# Patient Record
Sex: Female | Born: 1955 | Race: White | Hispanic: No | Marital: Married | Smoking: Never smoker
Health system: Southern US, Community
[De-identification: ages and names within clinical notes are randomized; demographics above are authoritative.]

## PROBLEM LIST (undated history)

## (undated) DIAGNOSIS — F32A Depression, unspecified: Secondary | ICD-10-CM

## (undated) DIAGNOSIS — E669 Obesity, unspecified: Secondary | ICD-10-CM

## (undated) DIAGNOSIS — K449 Diaphragmatic hernia without obstruction or gangrene: Secondary | ICD-10-CM

## (undated) DIAGNOSIS — E7800 Pure hypercholesterolemia, unspecified: Secondary | ICD-10-CM

## (undated) DIAGNOSIS — K219 Gastro-esophageal reflux disease without esophagitis: Secondary | ICD-10-CM

## (undated) DIAGNOSIS — I10 Essential (primary) hypertension: Secondary | ICD-10-CM

## (undated) DIAGNOSIS — F419 Anxiety disorder, unspecified: Secondary | ICD-10-CM

## (undated) DIAGNOSIS — F329 Major depressive disorder, single episode, unspecified: Secondary | ICD-10-CM

## (undated) DIAGNOSIS — F341 Dysthymic disorder: Secondary | ICD-10-CM

## (undated) DIAGNOSIS — R7303 Prediabetes: Secondary | ICD-10-CM

## (undated) DIAGNOSIS — E559 Vitamin D deficiency, unspecified: Secondary | ICD-10-CM

## (undated) HISTORY — DX: Diaphragmatic hernia without obstruction or gangrene: K44.9

## (undated) HISTORY — DX: Essential (primary) hypertension: I10

## (undated) HISTORY — DX: Vitamin D deficiency, unspecified: E55.9

## (undated) HISTORY — DX: Prediabetes: R73.03

## (undated) HISTORY — DX: Dysthymic disorder: F34.1

## (undated) HISTORY — DX: Pure hypercholesterolemia, unspecified: E78.00

## (undated) HISTORY — DX: Anxiety disorder, unspecified: F41.9

## (undated) HISTORY — DX: Major depressive disorder, single episode, unspecified: F32.9

## (undated) HISTORY — DX: Gastro-esophageal reflux disease without esophagitis: K21.9

## (undated) HISTORY — DX: Obesity, unspecified: E66.9

## (undated) HISTORY — DX: Depression, unspecified: F32.A

---

## 2001-02-06 ENCOUNTER — Encounter: Payer: Self-pay | Admitting: Family Medicine

## 2001-02-06 ENCOUNTER — Ambulatory Visit (HOSPITAL_COMMUNITY): Admission: RE | Admit: 2001-02-06 | Discharge: 2001-02-06 | Payer: Self-pay | Admitting: Family Medicine

## 2001-06-02 ENCOUNTER — Other Ambulatory Visit: Admission: RE | Admit: 2001-06-02 | Discharge: 2001-06-02 | Payer: Self-pay | Admitting: Obstetrics and Gynecology

## 2002-04-08 ENCOUNTER — Encounter: Payer: Self-pay | Admitting: Family Medicine

## 2002-04-08 ENCOUNTER — Ambulatory Visit (HOSPITAL_COMMUNITY): Admission: RE | Admit: 2002-04-08 | Discharge: 2002-04-08 | Payer: Self-pay | Admitting: Family Medicine

## 2003-04-26 ENCOUNTER — Ambulatory Visit (HOSPITAL_COMMUNITY): Admission: RE | Admit: 2003-04-26 | Discharge: 2003-04-26 | Payer: Self-pay | Admitting: Family Medicine

## 2004-04-27 ENCOUNTER — Ambulatory Visit (HOSPITAL_COMMUNITY): Admission: RE | Admit: 2004-04-27 | Discharge: 2004-04-27 | Payer: Self-pay | Admitting: Family Medicine

## 2005-05-03 ENCOUNTER — Ambulatory Visit (HOSPITAL_COMMUNITY): Admission: RE | Admit: 2005-05-03 | Discharge: 2005-05-03 | Payer: Self-pay | Admitting: Family Medicine

## 2005-12-02 ENCOUNTER — Encounter: Admission: RE | Admit: 2005-12-02 | Discharge: 2006-03-02 | Payer: Self-pay | Admitting: Family Medicine

## 2006-05-06 ENCOUNTER — Ambulatory Visit (HOSPITAL_COMMUNITY): Admission: RE | Admit: 2006-05-06 | Discharge: 2006-05-06 | Payer: Self-pay | Admitting: Family Medicine

## 2007-05-08 ENCOUNTER — Ambulatory Visit (HOSPITAL_COMMUNITY): Admission: RE | Admit: 2007-05-08 | Discharge: 2007-05-08 | Payer: Self-pay | Admitting: Family Medicine

## 2008-05-27 ENCOUNTER — Ambulatory Visit (HOSPITAL_COMMUNITY): Admission: RE | Admit: 2008-05-27 | Discharge: 2008-05-27 | Payer: Self-pay | Admitting: Family Medicine

## 2009-06-02 ENCOUNTER — Ambulatory Visit (HOSPITAL_COMMUNITY): Admission: RE | Admit: 2009-06-02 | Discharge: 2009-06-02 | Payer: Self-pay | Admitting: Family Medicine

## 2010-06-05 ENCOUNTER — Other Ambulatory Visit (HOSPITAL_COMMUNITY): Payer: Self-pay | Admitting: Family Medicine

## 2010-06-05 DIAGNOSIS — Z1231 Encounter for screening mammogram for malignant neoplasm of breast: Secondary | ICD-10-CM

## 2010-06-15 ENCOUNTER — Ambulatory Visit (HOSPITAL_COMMUNITY)
Admission: RE | Admit: 2010-06-15 | Discharge: 2010-06-15 | Disposition: A | Discharge status: Home / Self Care | Payer: Managed Care, Other (non HMO) | Source: Ambulatory Visit | Attending: Family Medicine | Admitting: Family Medicine

## 2010-06-15 DIAGNOSIS — Z1231 Encounter for screening mammogram for malignant neoplasm of breast: Secondary | ICD-10-CM | POA: Insufficient documentation

## 2010-09-26 ENCOUNTER — Other Ambulatory Visit: Payer: Self-pay | Admitting: Family Medicine

## 2010-09-26 ENCOUNTER — Ambulatory Visit
Admission: RE | Admit: 2010-09-26 | Discharge: 2010-09-26 | Disposition: A | Discharge status: Home / Self Care | Payer: Managed Care, Other (non HMO) | Source: Ambulatory Visit | Attending: Family Medicine | Admitting: Family Medicine

## 2010-09-26 DIAGNOSIS — R05 Cough: Secondary | ICD-10-CM

## 2011-08-06 ENCOUNTER — Other Ambulatory Visit (HOSPITAL_COMMUNITY): Payer: Self-pay | Admitting: Family Medicine

## 2011-08-06 DIAGNOSIS — Z1231 Encounter for screening mammogram for malignant neoplasm of breast: Secondary | ICD-10-CM

## 2011-08-23 ENCOUNTER — Ambulatory Visit (HOSPITAL_COMMUNITY)
Admission: RE | Admit: 2011-08-23 | Discharge: 2011-08-23 | Disposition: A | Discharge status: Home / Self Care | Payer: BC Managed Care – PPO | Source: Ambulatory Visit | Attending: Family Medicine | Admitting: Family Medicine

## 2011-08-23 DIAGNOSIS — Z1231 Encounter for screening mammogram for malignant neoplasm of breast: Secondary | ICD-10-CM | POA: Insufficient documentation

## 2012-07-30 ENCOUNTER — Other Ambulatory Visit (HOSPITAL_COMMUNITY): Payer: Self-pay | Admitting: Family Medicine

## 2012-07-30 DIAGNOSIS — Z1231 Encounter for screening mammogram for malignant neoplasm of breast: Secondary | ICD-10-CM

## 2012-08-29 IMAGING — CR DG CHEST 2V
2 series · 2 of 2 positions shown · non-contrast
Comparison: None.

CLINICAL DATA: Cough, shortness of breath

CHEST - 2 VIEW

[view not recorded (1 of 2)]
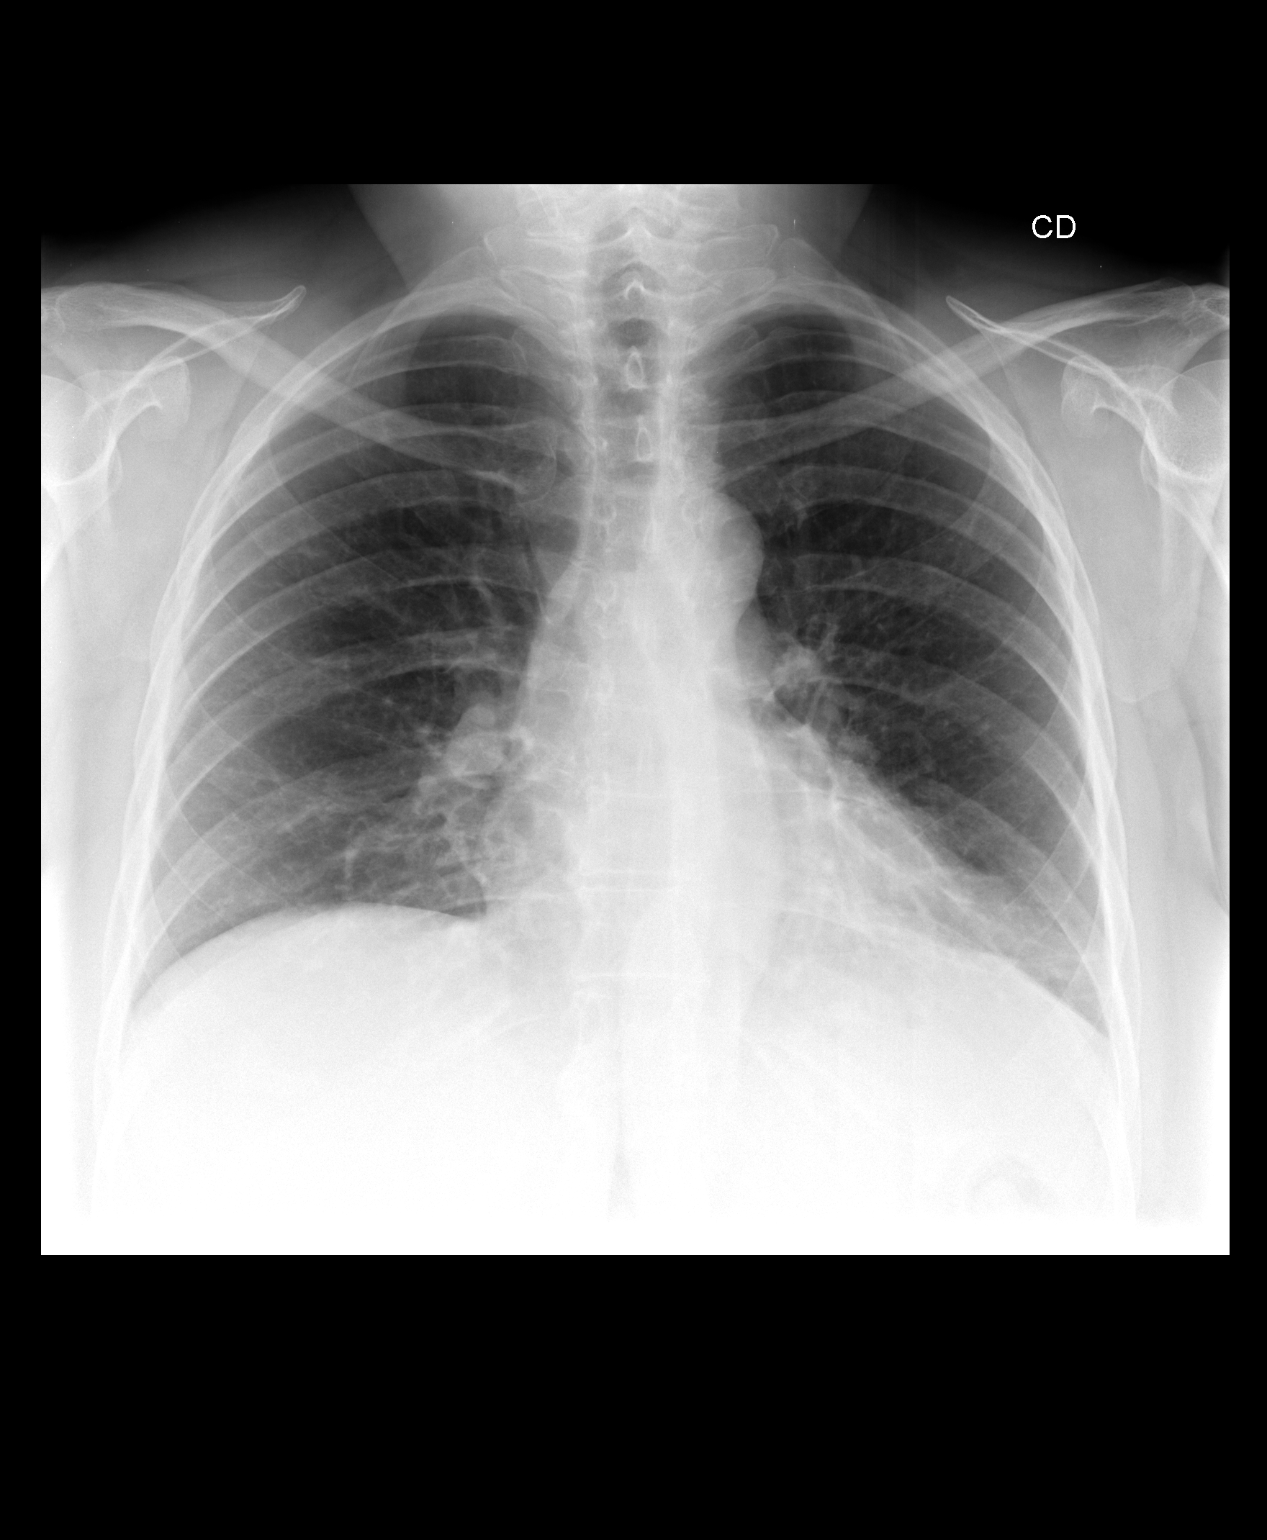

[view not recorded (2 of 2)]
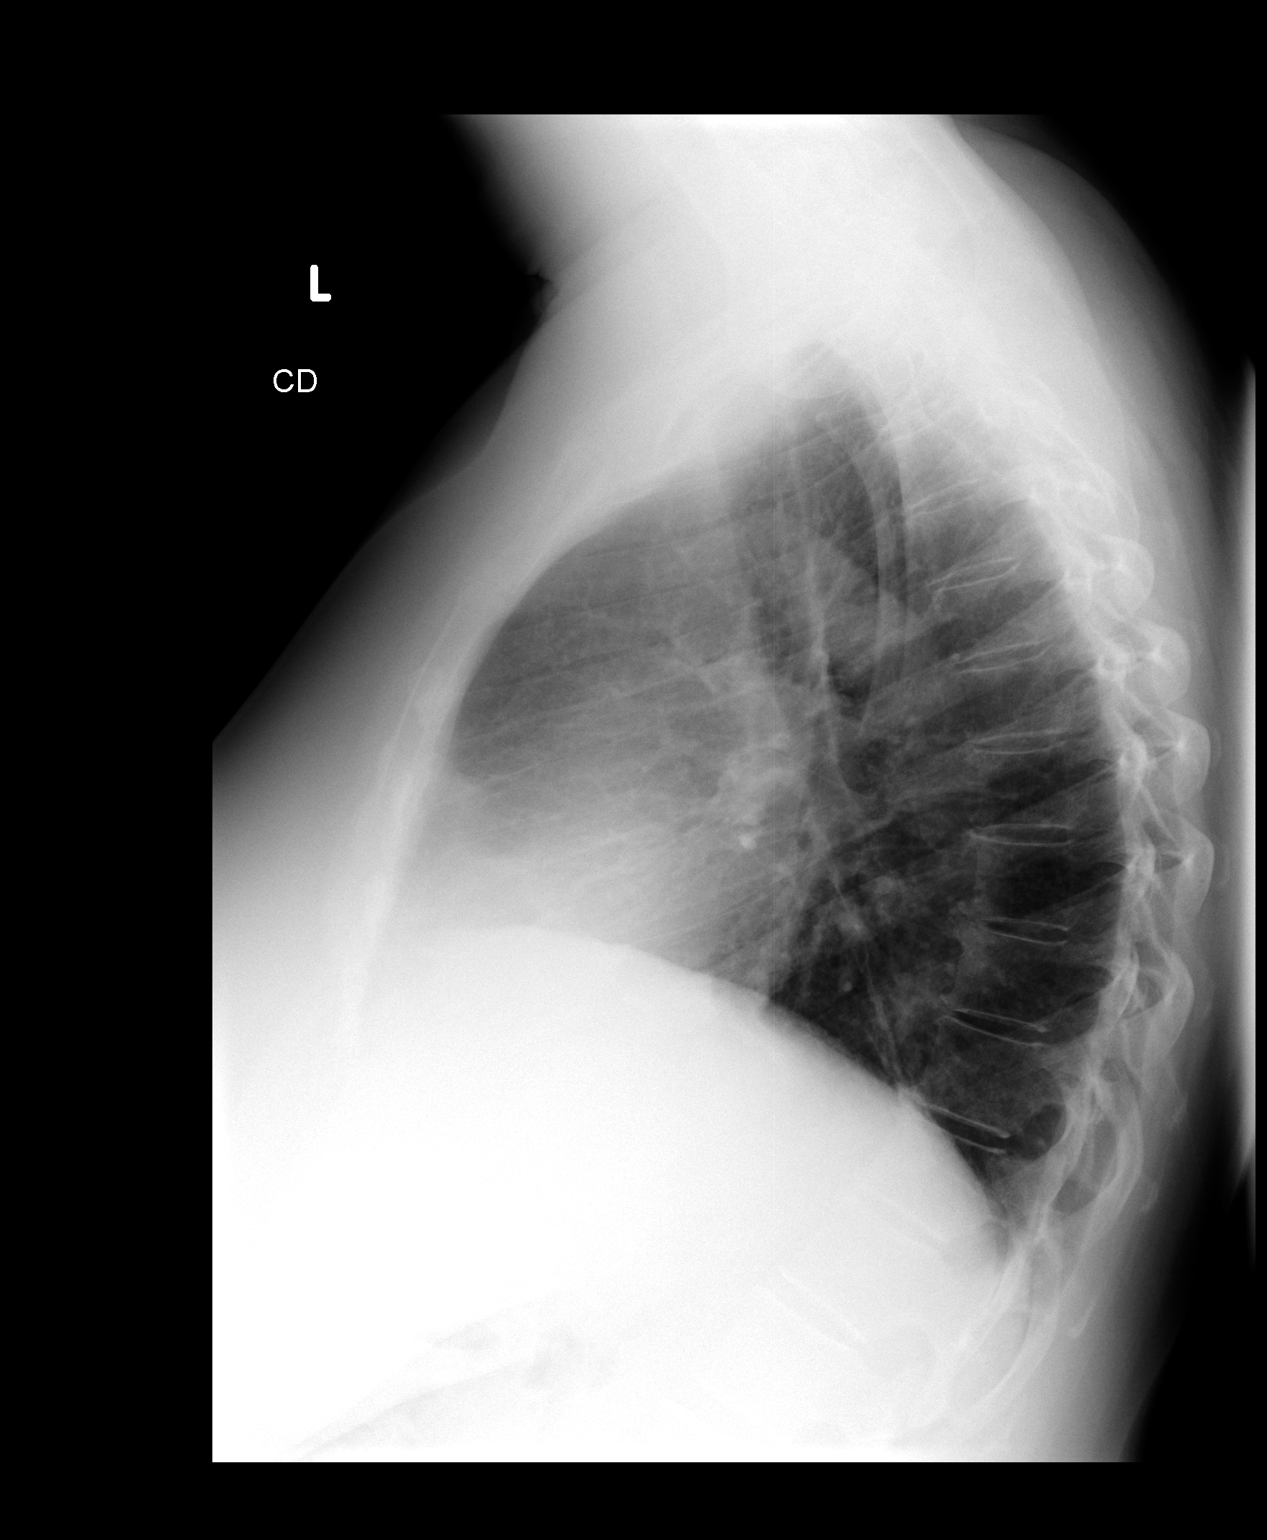

[2 of 2 positions shown; findings below may reference images not displayed]

FINDINGS: Mild left basilar atelectasis.  Lungs otherwise clear.
No pleural effusion or pneumothorax.

Heart is within normal limits.  Nodular opacity overlying the right
hilum, without corresponding abnormality on the lateral view,
likely reflecting confluence of vascular shadows.

Mild degenerative changes of the visualized thoracolumbar spine.
IMPRESSION: Possible mild left basilar atelectasis.  Otherwise, no evidence of
acute cardiopulmonary disease.

## 2012-08-31 ENCOUNTER — Ambulatory Visit (HOSPITAL_COMMUNITY)
Admission: RE | Admit: 2012-08-31 | Discharge: 2012-08-31 | Disposition: A | Discharge status: Home / Self Care | Payer: BC Managed Care – PPO | Source: Ambulatory Visit | Attending: Family Medicine | Admitting: Family Medicine

## 2012-08-31 ENCOUNTER — Other Ambulatory Visit (HOSPITAL_COMMUNITY): Payer: Self-pay | Admitting: Family Medicine

## 2012-08-31 DIAGNOSIS — Z1231 Encounter for screening mammogram for malignant neoplasm of breast: Secondary | ICD-10-CM

## 2012-12-21 ENCOUNTER — Ambulatory Visit: Payer: Self-pay | Admitting: Podiatry

## 2012-12-23 ENCOUNTER — Other Ambulatory Visit (HOSPITAL_COMMUNITY): Payer: Self-pay | Admitting: Obstetrics and Gynecology

## 2012-12-23 DIAGNOSIS — Z780 Asymptomatic menopausal state: Secondary | ICD-10-CM

## 2013-01-04 ENCOUNTER — Ambulatory Visit (HOSPITAL_COMMUNITY)
Admission: RE | Admit: 2013-01-04 | Discharge: 2013-01-04 | Disposition: A | Discharge status: Home / Self Care | Payer: BC Managed Care – PPO | Source: Ambulatory Visit | Attending: Obstetrics and Gynecology | Admitting: Obstetrics and Gynecology

## 2013-01-04 ENCOUNTER — Ambulatory Visit: Payer: BC Managed Care – PPO | Admitting: Podiatry

## 2013-01-04 ENCOUNTER — Encounter: Payer: Self-pay | Admitting: Podiatry

## 2013-01-04 VITALS — BP 119/72 | HR 64 | Resp 12

## 2013-01-04 DIAGNOSIS — B351 Tinea unguium: Secondary | ICD-10-CM

## 2013-01-04 DIAGNOSIS — Z13820 Encounter for screening for osteoporosis: Secondary | ICD-10-CM | POA: Insufficient documentation

## 2013-01-04 DIAGNOSIS — Z780 Asymptomatic menopausal state: Secondary | ICD-10-CM

## 2013-01-04 NOTE — Progress Notes (Signed)
   Subjective:    Patient ID: Makayla Wong, female    DOB: Jul 21, 1955, 57 y.o.   MRN: [id number]  HPI Comments: '' B/L GREAT TOENAIL AND 5TH TOENAIL ARE MUCH BETTER.''     Review of Systems  All other systems reviewed and are negative.       Objective:   Physical Exam        Assessment & Plan:

## 2013-01-04 NOTE — Progress Notes (Signed)
Subjective:     Patient ID: Makayla Wong, female   DOB: 1955/07/18, 57 y.o.   MRN: [id number]  HPI patient states my nails look better   Review of Systems     Objective:   Physical Exam Neurovascular status intact no health history changes noted with nails that are improving as far as yellow numbness and thicknes improving nailbeds with laser    Assessment:     Nailbeds are improving with laser treatment    Plan:     Approximate 1400 pulses for administration to 5 separate nails. Continue formula 3 in recheck

## 2014-09-01 ENCOUNTER — Other Ambulatory Visit: Payer: Self-pay | Admitting: Family Medicine

## 2014-09-01 ENCOUNTER — Ambulatory Visit
Admission: RE | Admit: 2014-09-01 | Discharge: 2014-09-01 | Disposition: A | Discharge status: Home / Self Care | Payer: BC Managed Care – PPO | Source: Ambulatory Visit | Attending: Family Medicine | Admitting: Family Medicine

## 2014-09-01 DIAGNOSIS — T1490XA Injury, unspecified, initial encounter: Secondary | ICD-10-CM

## 2017-06-16 ENCOUNTER — Other Ambulatory Visit: Payer: Self-pay | Admitting: Podiatry

## 2017-06-16 ENCOUNTER — Encounter: Payer: Self-pay | Admitting: Podiatry

## 2017-06-16 ENCOUNTER — Ambulatory Visit (INDEPENDENT_AMBULATORY_CARE_PROVIDER_SITE_OTHER): Payer: BC Managed Care – PPO

## 2017-06-16 ENCOUNTER — Ambulatory Visit: Payer: BC Managed Care – PPO | Admitting: Podiatry

## 2017-06-16 DIAGNOSIS — M79671 Pain in right foot: Secondary | ICD-10-CM | POA: Diagnosis not present

## 2017-06-16 DIAGNOSIS — M79672 Pain in left foot: Principal | ICD-10-CM

## 2017-06-16 DIAGNOSIS — M722 Plantar fascial fibromatosis: Secondary | ICD-10-CM

## 2017-06-16 MED ORDER — TRIAMCINOLONE ACETONIDE 10 MG/ML IJ SUSP
10.0000 mg | Freq: Once | INTRAMUSCULAR | Status: AC
  Administered 2017-06-16: 10 mg

## 2017-06-16 MED ORDER — DICLOFENAC SODIUM 75 MG PO TBEC: 75.0000 mg | DELAYED_RELEASE_TABLET | Freq: Two times a day (BID) | ORAL | 2 refills | Status: DC

## 2017-06-16 NOTE — Patient Instructions (Signed)

## 2017-06-16 NOTE — Progress Notes (Signed)
Subjective:   Patient ID: Makayla Wong, female   DOB: 62 y.o.   MRN: [id number]   HPI Patient presents stating she is having a lot more pain in the left over the right heel and that she is been on her foot more for about the last 8 months.  States is been hurting for around 6 months and patient does not smoke and likes to be active   Review of Systems  All other systems reviewed and are negative.       Objective:  Physical Exam  Constitutional: She appears well-developed and well-nourished.  Cardiovascular: Intact distal pulses.  Pulmonary/Chest: Effort normal.  Musculoskeletal: Normal range of motion.  Neurological: She is alert.  Skin: Skin is warm.  Nursing note and vitals reviewed.   Neurovascular status intact muscle strength is adequate range of motion within normal limits with patient found to have acute inflammation of the plantar aspect left heel at the insertional point of the tendon into the calcaneus with mild discomfort in the right heel.  Patient was noted to have good digital perfusion and is well oriented x3     Assessment:  Inflammatory fasciitis heel left over right with fluid buildup around the medial band     Plan:  H&P condition reviewed and today I injected the plantar fascial left 3 mg Kenalog 5 mg Xylocaine and applied fascial brace to lift up the arch.  Discussed physical therapy shoe gear modifications and placed on diclofenac 75 mg twice daily and reappoint to recheck  X-ray indicates there is spur formation with depression of the arch noted and patient ultimately will need orthotics for this long-term condition

## 2017-06-30 ENCOUNTER — Ambulatory Visit: Payer: BC Managed Care – PPO | Admitting: Podiatry

## 2017-06-30 ENCOUNTER — Encounter: Payer: Self-pay | Admitting: Podiatry

## 2017-06-30 DIAGNOSIS — M722 Plantar fascial fibromatosis: Secondary | ICD-10-CM | POA: Diagnosis not present

## 2017-06-30 MED ORDER — TRIAMCINOLONE ACETONIDE 10 MG/ML IJ SUSP
10.0000 mg | Freq: Once | INTRAMUSCULAR | Status: AC
  Administered 2017-06-30: 10 mg

## 2017-07-02 NOTE — Progress Notes (Signed)
Subjective:   Patient ID: Makayla Wong, female   DOB: 62 y.o.   MRN: [id number]   HPI Patient states that heel is improved but still has an area that is quite painful upon palpation   ROS      Objective:  Physical Exam  Neurovascular status intact with exquisite discomfort plantar aspect left heel at the insertional point of the tendon into the calcaneus with fluid buildup noted around the medial band     Assessment:  Acute plantar fasciitis still present     Plan:  Discussed long-term orthotics which I think will be best for her but at this point I did reinject the plantar fascia 3 mg Kenalog 5 mg Xylocaine explained physical therapy and reappoint to recheck 4 weeks or earlier if needed

## 2017-07-28 ENCOUNTER — Ambulatory Visit: Payer: BC Managed Care – PPO | Admitting: Podiatry

## 2017-07-28 ENCOUNTER — Encounter: Payer: Self-pay | Admitting: Podiatry

## 2017-07-28 DIAGNOSIS — M722 Plantar fascial fibromatosis: Secondary | ICD-10-CM

## 2017-07-30 NOTE — Progress Notes (Signed)
Subjective:   Patient ID: Makayla SparkJody F Pinzon, female   DOB: 62 y.o.   MRN: [id number]   HPI Patient states that heel was feeling quite a bit better with minimal discomfort and able to walk without pain currently.  States that she has modified shoe gear   ROS      Objective:  Physical Exam  Neurovascular status intact with patient's plantar pain left improved with pain still present upon deep palpation quite a bit better     Assessment:  Plantar fasciitis improving at the current time     Plan:  Reviewed physical therapy anti-inflammatories and the usage of supportive shoe gear.  Patient is discharged unless symptoms were to persist or get worse

## 2017-07-31 ENCOUNTER — Telehealth: Payer: Self-pay | Admitting: Podiatry

## 2017-07-31 NOTE — Telephone Encounter (Signed)
I told pt she should wear the plantar fascial brace at all times when weight bearing for at least another 2 weeks, and complete the antiinflammatory, then try short periods of time without the brace, but wear when up for long periods of time like touring or shopping when it was not convenient to rest or reapply the brace and gradually increase the time out of the brace, could refill the antiinflammatory for periods of time when having discomfort, but if continue to have problems make an appt to be reevaluated.

## 2017-07-31 NOTE — Telephone Encounter (Signed)
Pt wanted to know if she needed to wear her brace for plantar fas, all the time. She also wants to know if she needs to continue anti inflammatory meds.

## 2017-09-12 ENCOUNTER — Telehealth: Payer: Self-pay | Admitting: *Deleted

## 2017-09-12 MED ORDER — DICLOFENAC SODIUM 75 MG PO TBEC: 75.0000 mg | DELAYED_RELEASE_TABLET | Freq: Two times a day (BID) | ORAL | 0 refills | Status: DC

## 2017-09-12 NOTE — Telephone Encounter (Signed)
Refill request for diclofenac. Dr. Charlsie Merles states refill once and pt needs an appt prior to future refills.

## 2018-03-06 ENCOUNTER — Other Ambulatory Visit: Payer: Self-pay | Admitting: Family Medicine

## 2018-03-06 ENCOUNTER — Ambulatory Visit
Admission: RE | Admit: 2018-03-06 | Discharge: 2018-03-06 | Disposition: A | Discharge status: Home / Self Care | Payer: BC Managed Care – PPO | Source: Ambulatory Visit | Attending: Family Medicine | Admitting: Family Medicine

## 2018-03-06 DIAGNOSIS — Z01818 Encounter for other preprocedural examination: Secondary | ICD-10-CM

## 2018-03-10 ENCOUNTER — Encounter: Payer: Self-pay | Admitting: Cardiology

## 2018-03-23 ENCOUNTER — Encounter: Payer: Self-pay | Admitting: Cardiology

## 2018-03-24 ENCOUNTER — Telehealth: Payer: Self-pay | Admitting: Cardiology

## 2018-03-24 DIAGNOSIS — R9431 Abnormal electrocardiogram [ECG] [EKG]: Secondary | ICD-10-CM

## 2018-03-24 NOTE — Telephone Encounter (Signed)
Orders placed, message sent to [facility] to schedule.

## 2018-03-24 NOTE — Telephone Encounter (Addendum)
Patient has an appointment scheduled and due to the coron virus outbreak has been called to determine if appointment can be rescheduled.  Patient was referred to cardiology due to abnormal EKG and cardiac clearance for hip surgery.  In review of her EKG it appears essentially normal except for poor R wave progression in the anterior leads.  She has had no active symptoms.  Would recommend 2D echocardiogram in 10 days - her surgery is scheduled for 4/2 - lease set up.

## 2018-03-25 ENCOUNTER — Ambulatory Visit: Payer: BC Managed Care – PPO | Admitting: Cardiology

## 2018-03-31 NOTE — Telephone Encounter (Signed)
Spoke with the patient, he surgery was cancelled and she expressed understanding about delaying her testing till around May. She had no further questions.

## 2018-04-09 ENCOUNTER — Telehealth: Payer: Self-pay | Admitting: Cardiology

## 2018-04-09 NOTE — Telephone Encounter (Signed)
Makayla Wong from [facility] was calling on behalf of the pt. The pt was supposed to have an appt on 03/18 for surgical clearance, but that visit was cancelled.  The pt's surgery is pending cardiac clearance. Is there a way to screen her via virtual visit for clearance?

## 2018-04-10 ENCOUNTER — Telehealth: Payer: Self-pay | Admitting: *Deleted

## 2018-04-10 NOTE — Telephone Encounter (Signed)
TELEPHONE CALL NOTE  This patient has been deemed a candidate for follow-up tele-health visit to limit community exposure during the Covid-19 pandemic. I spoke with the patient via phone to discuss instructions. This has been outlined on the patient's AVS (dotphrase: hcevisitinfo). The patient was advised to review the section on consent for treatment as well. The patient will receive a phone call 2-3 days prior to their E-Visit at which time consent will be verbally confirmed. A Virtual Office Visit appointment type has been scheduled for 8:50 am on 04/10/18 with Dr. Mayford Knife, with "VIDEO" or "TELEPHONE" in the appointment notes - patient prefers  type.  I have either confirmed the patient is active in MyChart or offered to send sign-up link to phone/email via Mychart icon beside patient's photo.  Dustin Flock, RN 04/10/2018 10:52 AM   Makayla Wong has been deemed a candidate for a follow-up tele-health visit to limit community exposure during the Covid-19 pandemic. I spoke with the patient via phone to ensure availability of phone/video source, confirm preferred email & phone number, and discuss instructions and expectations.  I reminded Makayla Wong to be prepared with any vital sign and/or heart rhythm information that could potentially be obtained via home monitoring, at the time of her visit. I reminded Makayla Wong to expect a phone call at the time of her visit if her visit.  Did the patient verbally acknowledge consent to treatment? YES  Dustin Flock, RN 04/10/2018 10:52 AM  CONSENT FOR TELE-HEALTH VISIT - PLEASE REVIEW  I hereby voluntarily request, consent and authorize [facility] and its employed or contracted physicians, physician assistants, nurse practitioners or other licensed health care professionals (the Practitioner), to provide me with telemedicine health care services (the "Services") as deemed necessary by the treating Practitioner. I acknowledge and consent to  receive the Services by the Practitioner via telemedicine. I understand that the telemedicine visit will involve communicating with the Practitioner through live audiovisual communication technology and the disclosure of certain medical information by electronic transmission. I acknowledge that I have been given the opportunity to request an in-person assessment or other available alternative prior to the telemedicine visit and am voluntarily participating in the telemedicine visit.  I understand that I have the right to withhold or withdraw my consent to the use of telemedicine in the course of my care at any time, without affecting my right to future care or treatment, and that the Practitioner or I may terminate the telemedicine visit at any time. I understand that I have the right to inspect all information obtained and/or recorded in the course of the telemedicine visit and may receive copies of available information for a reasonable fee.  I understand that some of the potential risks of receiving the Services via telemedicine include:  Marland Kitchen Delay or interruption in medical evaluation due to technological equipment failure or disruption; . Information transmitted may not be sufficient (e.g. poor resolution of images) to allow for appropriate medical decision making by the Practitioner; and/or  . In rare instances, security protocols could fail, causing a breach of personal health information.  Furthermore, I acknowledge that it is my responsibility to provide information about my medical history, conditions and care that is complete and accurate to the best of my ability. I acknowledge that Practitioner's advice, recommendations, and/or decision may be based on factors not within their control, such as incomplete or inaccurate data provided by me or distortions of diagnostic images or specimens that may  result from electronic transmissions. I understand that the practice of medicine is not an exact science  and that Practitioner makes no warranties or guarantees regarding treatment outcomes. I acknowledge that I will receive a copy of this consent concurrently upon execution via email to the email address I last provided but may also request a printed copy by calling the office of [facility].    I understand that my insurance will be billed for this visit.   I have read or had this consent read to me. . I understand the contents of this consent, which adequately explains the benefits and risks of the Services being provided via telemedicine.  . I have been provided ample opportunity to ask questions regarding this consent and the Services and have had my questions answered to my satisfaction. . I give my informed consent for the services to be provided through the use of telemedicine in my medical care  By participating in this telemedicine visit I agree to the above.

## 2018-04-10 NOTE — Telephone Encounter (Signed)
   [facility] Pre-operative Risk Assessment    Request for surgical clearance:  1. What type of surgery is being performed? LEFT TOTAL HIP ARTHORPLASTY  2. When is this surgery scheduled?  PENDING CLEARANCE   3. What type of clearance is required (medical clearance vs. Pharmacy clearance to hold med vs. Both)?  MEDICAL  4. Are there any medications that need to be held prior to surgery and how long? NONE LISTED   5. Practice name and name of physician performing surgery?  [facility], DR. DALIDORF   6. What is your office phone number [phone]   7.   What is your office fax number [phone]  8.   Anesthesia type (None, local, MAC, general) ? SPINAL   Makayla Wong 04/10/2018, 7:46 AM  _________________________________________________________________   (provider comments below)

## 2018-04-10 NOTE — Telephone Encounter (Signed)
No anticoagulants listed in profile.

## 2018-04-10 NOTE — Telephone Encounter (Signed)
   Primary Cardiologist:Traci Turner, MD  Chart reviewed as part of pre-operative protocol coverage. Patient's original preoperative clearance office visit was canceled due to COVID-19 protocols. I reviewed the chart with Dr. Mayford Knife. Dr. Mayford Knife recommends an echocardiogram prior to clearance. Echocardiogram has bee rescheduled for 05/07/18. She will be scheduled for a telehealth visit following that echocardiogram to complete her cardiac clearance for surgery.   Pre-op covering staff: - Please schedule a telehealth visit appointment with Dr. Mayford Knife, possibly on 05/07/18 following her echocardiogram. - Please call patient to inform them. - Please contact requesting surgeon's office via preferred method (i.e, phone, fax) to inform them of need for appointment prior to surgery.  If applicable, this message will also be routed to pharmacy pool and/or primary cardiologist for input on holding anticoagulant/antiplatelet agent as requested below so that this information is available at time of patient's appointment.   Makayla Rutherford Natoria Archibald, PA  04/10/2018, 8:40 AM

## 2018-04-11 NOTE — Telephone Encounter (Signed)
Ben change her tele visit until after her echo and not this week

## 2018-04-14 NOTE — Telephone Encounter (Signed)
Scheduled appointment on 4/30 at 10:30 with Dr. Mayford Knife.

## 2018-04-17 ENCOUNTER — Telehealth: Payer: BC Managed Care – PPO | Admitting: Cardiology

## 2018-04-30 ENCOUNTER — Telehealth: Payer: Self-pay | Admitting: Cardiovascular Disease

## 2018-04-30 NOTE — Telephone Encounter (Signed)
   Primary Cardiologist:  Armanda Magic, MD   Patient contacted.  History reviewed.    Pt is for pre-op hib replacement Was scheduled for see Turner and have an echo on April 30  Has no cardiac history  Its not clear why she needs cardiac clearance prior to hip surgery  No CP, no dyspnea Is fairly active.  Limited by her hip pain  Works at [employer] center   She is very stable She agrees with delaying the echo since the hip surgery has already been delayed.  I've sent a message to Dr. Mayford Knife to see if she wants to postpone the office visit for 4/30 also   Priority level 3    No symptoms to suggest any unstable cardiac conditions.  Based on discussion, with current pandemic situation, we will be postponing this appointment for Makayla Wong with a plan for f/u in  12  wks or sooner if feasible/necessary.  If symptoms change, she has been instructed to contact our office.   Routing to C19 CANCEL pool for tracking (P CV DIV CV19 CANCEL - reason for visit "other.") and assigning priority ( 3 = >12 wks).   Kristeen Miss, MD  04/30/2018 11:37 AM         .

## 2018-05-04 NOTE — Telephone Encounter (Signed)
OK to keep OV appt as long as it will be video due to preop clearance needed.  SUrgery postponed but can still do visit

## 2018-05-05 NOTE — Telephone Encounter (Signed)
Spoke with the patient, she requested both her virtual visit and echo be rescheduled later once her surgery was scheduled. She will call back to schedule later.

## 2018-05-07 ENCOUNTER — Telehealth: Payer: BC Managed Care – PPO | Admitting: Cardiology

## 2018-05-07 ENCOUNTER — Other Ambulatory Visit (HOSPITAL_COMMUNITY): Payer: BC Managed Care – PPO

## 2018-05-28 ENCOUNTER — Telehealth (HOSPITAL_COMMUNITY): Payer: Self-pay

## 2018-05-28 NOTE — Telephone Encounter (Signed)
LMTCB for schedule echo.

## 2018-06-08 ENCOUNTER — Telehealth (HOSPITAL_COMMUNITY): Payer: Self-pay

## 2018-06-08 NOTE — Telephone Encounter (Signed)

## 2018-06-09 ENCOUNTER — Telehealth (INDEPENDENT_AMBULATORY_CARE_PROVIDER_SITE_OTHER): Payer: BC Managed Care – PPO | Admitting: Cardiology

## 2018-06-09 ENCOUNTER — Ambulatory Visit (HOSPITAL_COMMUNITY): Payer: BC Managed Care – PPO | Attending: Cardiology

## 2018-06-09 ENCOUNTER — Encounter: Payer: Self-pay | Admitting: Cardiology

## 2018-06-09 ENCOUNTER — Other Ambulatory Visit: Payer: Self-pay

## 2018-06-09 DIAGNOSIS — R9431 Abnormal electrocardiogram [ECG] [EKG]: Secondary | ICD-10-CM | POA: Insufficient documentation

## 2018-06-09 DIAGNOSIS — Z01818 Encounter for other preprocedural examination: Secondary | ICD-10-CM

## 2018-06-09 NOTE — Patient Instructions (Signed)
Medication Instructions:  Your physician recommends that you continue on your current medications as directed. Please refer to the Current Medication list given to you today.  If you need a refill on your cardiac medications before your next appointment, please call your pharmacy.   Lab work: None If you have labs (blood work) drawn today and your tests are completely normal, you will receive your results only by: Marland Kitchen MyChart Message (if you have MyChart) OR . A paper copy in the mail If you have any lab test that is abnormal or we need to change your treatment, we will call you to review the results.  Testing/Procedures: None  Follow-Up: As needed, pending results.

## 2018-06-09 NOTE — Progress Notes (Signed)
Virtual Visit via Video Note   This visit type was conducted due to national recommendations for restrictions regarding the COVID-19 Pandemic (e.g. social distancing) in an effort to limit this patient's exposure and mitigate transmission in our community.  Due to her co-morbid illnesses, this patient is at least at moderate risk for complications without adequate follow up.  This format is felt to be most appropriate for this patient at this time.  All issues noted in this document were discussed and addressed.  A limited physical exam was performed with this format.  Please refer to the patient's chart for her consent to telehealth for [facility].   Evaluation Performed:  Cardiology Consult  This visit type was conducted due to national recommendations for restrictions regarding the COVID-19 Pandemic (e.g. social distancing).  This format is felt to be most appropriate for this patient at this time.  All issues noted in this document were discussed and addressed.  No physical exam was performed (except for noted visual exam findings with Video Visits).  Please refer to the patient's chart (MyChart message for video visits and phone note for telephone visits) for the patient's consent to telehealth for [facility].   Date:  06/09/2018   ID:  Makayla Wong, DOB 10-31-1955, MRN [id number]  Patient Location:  Home  Provider location:   [place]  PCP:  Makayla Bussing, MD  Cardiologist:  Makayla Magic, MD Electrophysiologist:  None   Chief Complaint:  Preoperative cardiac clearance.  History of Present Illness:    Makayla Wong is a 63 y.o. female who presents via audio/video conferencing for a telehealth visit today in referral by Makayla Bussing, MD for preoperative cardiac clearance.     This is a 63yo female with a history of depression, GERD with HH, HTN, hyperlipidemia, obesity and preDM.  She is here today for preoperative cardiac clearance for hip replacement surgery.  She was  seen by her PCP for preop clearance and an EKG was done which showed poor R wave progression in the anterior precordial leads.  He felt this was likely due to lead placement in the setting of large breasts but wanted to make sure that there was no cardiac abnormality..  She denies any chest pain or pressure, SOB, DOE, PND, orthopnea, LE edema, dizziness, palpitations or syncope. She is compliant with her meds and is tolerating meds with no SE.    The patient does not have symptoms concerning for COVID-19 infection (fever, chills, cough, or new shortness of breath).    Prior CV studies:   The following studies were reviewed today:  EKG  Past Medical History:  Diagnosis Date   Anxiety    Depression    Dysthymia    GERD (gastroesophageal reflux disease)    Hiatal hernia with GERD    Hypercholesterolemia    Hypertension    Obesity    Pre-diabetes    Vitamin D deficiency    No past surgical history on file.   Current Meds  Medication Sig   ACIPHEX 20 MG tablet Take 20 mg by mouth daily.    ALPRAZolam (XANAX) 0.25 MG tablet Take 0.25 mg by mouth at bedtime as needed for anxiety. Pt takes for flying   atenolol (TENORMIN) 25 MG tablet Take 25 mg by mouth daily.    Multiple Vitamin (MULTIVITAMIN) tablet Take 1 tablet by mouth daily.   sertraline (ZOLOFT) 50 MG tablet Take 50 mg by mouth daily.    triamterene-hydrochlorothiazide (MAXZIDE-25) 37.5-25 MG per  tablet Take 1 tablet by mouth daily.    Vitamin D, Ergocalciferol, (DRISDOL) 50000 UNITS CAPS capsule Take 50,000 Units by mouth every 7 (seven) days.      Allergies:   Hydrocodone; Other; and Percocet [oxycodone-acetaminophen]   Social History   Tobacco Use   Smoking status: Never Smoker   Smokeless tobacco: Never Used  Substance Use Topics   Alcohol use: Yes   Drug use: No     Family Hx: The patient's family history includes Hypertension in her mother; Renal cancer in her father.  ROS:   Please see  the history of present illness.     All other systems reviewed and are negative.   Labs/Other Tests and Data Reviewed:    Recent Labs: No results found for requested labs within last 8760 hours.   Recent Lipid Panel No results found for: CHOL, TRIG, HDL, CHOLHDL, LDLCALC, LDLDIRECT  Wt Readings from Last 3 Encounters:  06/09/18 172 lb (78 kg)     Objective:    Vital Signs:  Ht 5' (1.524 m)    Wt 172 lb (78 kg)    BMI 33.59 kg/m    CONSTITUTIONAL:  Well nourished, well developed female in no acute distress.  EYES: anicteric MOUTH: oral mucosa is pink RESPIRATORY: Normal respiratory effort, symmetric expansion CARDIOVASCULAR: No peripheral edema SKIN: No rash, lesions or ulcers MUSCULOSKELETAL: no digital cyanosis NEURO: Cranial Nerves II-XII grossly intact, moves all extremities PSYCH: Intact judgement and insight.  A&O x 3, Mood/affect appropriate   ASSESSMENT & PLAN:    1.  Preoperative cardiac clearance - she has a hip replacement scheduled.  Her PCP did a preop EKG which showed poor R wave progression was concerned that this represented a prior MI although she has large breasts that he thought it could also be due to that.  She had a 2D echocardiogram done this morning which is pending to make sure that there is no wall motion abnormalities.  She is completely asymptomatic from a cardiac standpoint.  She is to do at least 4 METS of activity.  She works outside in her garden also is able to walk 4 blocks without any chest pain or shortness of breath.  She has never smoked.  She does not have any family history of CAD that she knows of.  Her risk factors include obesity, hypertension and hyperlipidemia.  Her revised cardiac risk index is 0.4% for major cardiac event in the perioperative period.  This is considered low risk and okay to proceed with surgery without any further cardiac work-up.  COVID-19 Education: The signs and symptoms of COVID-19 were discussed with the patient  and how to seek care for testing (follow up with PCP or arrange E-visit).  The importance of social distancing was discussed today.  Patient Risk:   After full review of this patient's clinical status, I feel that they are at least moderate risk at this time.  Time:   Today, I have spent 25 minutes in telehealth discussing medical problems including HTN, lipids and risks for CAD.  We also reviewed the symptoms of COVID 19 and the ways to protect against contracting the virus with telehealth technology.  I spent an additional 5 minutes reviewing patient's chart including EKG and PCP OV notes.  Medication Adjustments/Labs and Tests Ordered: Current medicines are reviewed at length with the patient today.  Concerns regarding medicines are outlined above.  Tests Ordered: No orders of the defined types were placed in this encounter.  Medication  Changes: No orders of the defined types were placed in this encounter.   Disposition:  Follow up prn  Signed, Makayla Magicraci Miquel Stacks, MD  06/09/2018 2:59 PM    [facility]

## 2021-04-25 ENCOUNTER — Other Ambulatory Visit: Payer: Self-pay | Admitting: Obstetrics and Gynecology

## 2021-04-25 DIAGNOSIS — E2839 Other primary ovarian failure: Secondary | ICD-10-CM

## 2021-06-21 ENCOUNTER — Ambulatory Visit
Admission: RE | Admit: 2021-06-21 | Discharge: 2021-06-21 | Disposition: A | Discharge status: Home / Self Care | Payer: BC Managed Care – PPO | Source: Ambulatory Visit | Attending: Obstetrics and Gynecology | Admitting: Obstetrics and Gynecology

## 2021-06-21 DIAGNOSIS — E2839 Other primary ovarian failure: Secondary | ICD-10-CM

## 2023-05-13 ENCOUNTER — Other Ambulatory Visit: Payer: Self-pay | Admitting: Podiatry

## 2023-05-13 ENCOUNTER — Ambulatory Visit: Payer: Self-pay | Admitting: Podiatry

## 2023-05-13 ENCOUNTER — Ambulatory Visit (INDEPENDENT_AMBULATORY_CARE_PROVIDER_SITE_OTHER)

## 2023-05-13 ENCOUNTER — Ambulatory Visit (INDEPENDENT_AMBULATORY_CARE_PROVIDER_SITE_OTHER): Admitting: Podiatry

## 2023-05-13 DIAGNOSIS — M7751 Other enthesopathy of right foot: Secondary | ICD-10-CM

## 2023-05-13 DIAGNOSIS — M8580 Other specified disorders of bone density and structure, unspecified site: Secondary | ICD-10-CM | POA: Insufficient documentation

## 2023-05-13 DIAGNOSIS — L821 Other seborrheic keratosis: Secondary | ICD-10-CM | POA: Insufficient documentation

## 2023-05-13 DIAGNOSIS — I10 Essential (primary) hypertension: Secondary | ICD-10-CM | POA: Insufficient documentation

## 2023-05-13 DIAGNOSIS — K219 Gastro-esophageal reflux disease without esophagitis: Secondary | ICD-10-CM | POA: Insufficient documentation

## 2023-05-13 DIAGNOSIS — N841 Polyp of cervix uteri: Secondary | ICD-10-CM | POA: Insufficient documentation

## 2023-05-13 DIAGNOSIS — E669 Obesity, unspecified: Secondary | ICD-10-CM | POA: Insufficient documentation

## 2023-05-13 DIAGNOSIS — M205X1 Other deformities of toe(s) (acquired), right foot: Secondary | ICD-10-CM | POA: Diagnosis not present

## 2023-05-13 DIAGNOSIS — D259 Leiomyoma of uterus, unspecified: Secondary | ICD-10-CM | POA: Insufficient documentation

## 2023-05-14 NOTE — Progress Notes (Signed)
 Subjective:  Patient ID: Makayla Wong, female    DOB: 1955/08/15,  MRN: [id number] HPI Chief Complaint  Patient presents with   Foot Pain    Pt presents for pain of right foot where she had a bunionectomy, states it hurts when she walk.    68 y.o. female presents with the above complaint.   ROS: Denies fever chills nausea vomit muscle aches pains calf pain back pain chest pain shortness of breath.  Past Medical History:  Diagnosis Date   Anxiety    Depression    Dysthymia    GERD (gastroesophageal reflux disease)    Hiatal hernia with GERD    Hypercholesterolemia    Hypertension    Obesity    Pre-diabetes    Vitamin D deficiency    No past surgical history on file.  Current Outpatient Medications:    atorvastatin (LIPITOR) 20 MG tablet, Take 20 mg by mouth daily., Disp: , Rfl:    ACIPHEX 20 MG tablet, Take 20 mg by mouth daily. , Disp: , Rfl:    ALPRAZolam (XANAX) 0.25 MG tablet, Take 0.25 mg by mouth at bedtime as needed for anxiety. Pt takes for flying, Disp: , Rfl:    atenolol (TENORMIN) 25 MG tablet, Take 25 mg by mouth daily. , Disp: , Rfl:    Multiple Vitamin (MULTIVITAMIN) tablet, Take 1 tablet by mouth daily., Disp: , Rfl:    sertraline (ZOLOFT) 50 MG tablet, Take 50 mg by mouth daily. , Disp: , Rfl:    triamterene-hydrochlorothiazide (MAXZIDE-25) 37.5-25 MG per tablet, Take 1 tablet by mouth daily. , Disp: , Rfl:    Vitamin D, Ergocalciferol, (DRISDOL) 50000 UNITS CAPS capsule, Take 50,000 Units by mouth every 7 (seven) days. , Disp: , Rfl:   Allergies  Allergen Reactions   Aspartame Other (See Comments)    Other Reaction(s): Not available, Unknown   Doxycycline     Other Reaction(s): Unknown   Hydrocodone Nausea And Vomiting   Monosodium Glutamate Other (See Comments)    Other Reaction(s): Not available   Other     Artificial Sweetner - gets N/V; gets a headache   Oxycodone Other (See Comments)   Percocet [Oxycodone-Acetaminophen] Nausea And Vomiting    Review of Systems Objective:  There were no vitals filed for this visit.  General: Well developed, nourished, in no acute distress, alert and oriented x3   Dermatological: Skin is warm, dry and supple bilateral. Nails x 10 are well maintained; remaining integument appears unremarkable at this time. There are no open sores, no preulcerative lesions, no rash or signs of infection present.  Vascular: Dorsalis Pedis artery and Posterior Tibial artery pedal pulses are 2/4 bilateral with immedate capillary fill time. Pedal hair growth present. No varicosities and no lower extremity edema present bilateral.   Neruologic: Grossly intact via light touch bilateral. Vibratory intact via tuning fork bilateral. Protective threshold with Semmes Wienstein monofilament intact to all pedal sites bilateral. Patellar and Achilles deep tendon reflexes 2+ bilateral. No Babinski or clonus noted bilateral.   Musculoskeletal: No gross boney pedal deformities bilateral. No pain, crepitus, or limitation noted with foot and ankle range of motion bilateral. Muscular strength 5/5 in all groups tested bilateral.  She has some tenderness on range of motion of the first metatarsal phalangeal joint of the right foot otherwise no erythema edema cellulitis drainage or odor.  Gait: Unassisted, Nonantalgic.    Radiographs:  Radiographs demonstrate osseously mature individual plantar distally oriented calcaneal heel spur to the rear  foot forefoot demonstrates first metatarsal osteotomy which is gone on to heal uneventfully with 2 screws intact.  The toe is sitting rectus there is joint space narrowing some subchondral eburnation consistent with osteoarthritic change.  Assessment & Plan:   Assessment: Osteoarthritis first metatarsophalangeal joint right.  Plan: Discussed etiology pathology conservative surgical therapies offered injection she declined offered oral medication she declined she and I did discuss stiff soled  shoes she understands and is amendable to it and also is amendable to a topical anti-inflammatory such as Voltaren  gel.     Glendell Schlottman T. [place], [place]

## 2023-06-24 ENCOUNTER — Encounter: Payer: Self-pay | Admitting: Physical Therapy

## 2023-06-24 ENCOUNTER — Other Ambulatory Visit: Payer: Self-pay

## 2023-06-24 ENCOUNTER — Ambulatory Visit: Attending: Family Medicine | Admitting: Physical Therapy

## 2023-06-24 DIAGNOSIS — M6281 Muscle weakness (generalized): Secondary | ICD-10-CM | POA: Insufficient documentation

## 2023-06-24 DIAGNOSIS — R293 Abnormal posture: Secondary | ICD-10-CM | POA: Insufficient documentation

## 2023-06-24 DIAGNOSIS — R279 Unspecified lack of coordination: Secondary | ICD-10-CM | POA: Insufficient documentation

## 2023-06-24 NOTE — Therapy (Signed)
 OUTPATIENT PHYSICAL THERAPY FEMALE PELVIC EVALUATION   Patient Name: Makayla Wong MRN: [id number] DOB:01/27/1955, 68 y.o., female Today's Date: 06/24/2023  END OF SESSION:  PT End of Session - 06/24/23 1702     Visit Number 1    Number of Visits 8    Date for PT Re-Evaluation 07/22/23    Authorization Type Medicare/[employer]    PT Start Time 0415    PT Stop Time 0505    PT Time Calculation (min) 50 min    Activity Tolerance Patient tolerated treatment well    Behavior During Therapy WFL for tasks assessed/performed          Past Medical History:  Diagnosis Date   Anxiety    Depression    Dysthymia    GERD (gastroesophageal reflux disease)    Hiatal hernia with GERD    Hypercholesterolemia    Hypertension    Obesity    Pre-diabetes    Vitamin D deficiency    History reviewed. No pertinent surgical history. Patient Active Problem List   Diagnosis Date Noted   Acid reflux 05/13/2023   Hypertensive disorder 05/13/2023   Obesity 05/13/2023   Osteopenia 05/13/2023   Polyp of cervix 05/13/2023   Seborrheic keratoses 05/13/2023   Uterine leiomyoma 05/13/2023   Preoperative clearance 06/09/2018    PCP: Lanae Pinal, MD   REFERRING PROVIDER: Lanae Pinal, MD   REFERRING DIAG: N39.3 (ICD-10-CM) - Stress incontinence (female) (female)  THERAPY DIAG:  Muscle weakness (generalized)  Unspecified lack of coordination  Abnormal posture  Rationale for Evaluation and Treatment: Rehabilitation  ONSET DATE: March of this year is when it became intense   SUBJECTIVE:                                                                                                                                                                                           SUBJECTIVE STATEMENT: Patient reports to PFPT with stress urinary incontinence. She had a cold in march that exacerbated this. No pelvic pain to report.  Fluid intake: at least 40 oz water daily, fresh OJ in the mornings,  cup of hot tea in the mornings (caffeine)   PAIN:  Are you having pain? No NPRS scale: 0/10  PRECAUTIONS: None  RED FLAGS: None   WEIGHT BEARING RESTRICTIONS: No  FALLS:  Has patient fallen in last 6 months? 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; works in the yard; enjoys walking   PLOF: Independent with basic ADLs  PATIENT  GOALS: to control the leakage from happening   PERTINENT HISTORY:  C section 1991, 2 total hip replacements (2020, 2022)  Hiatal hernia   BOWEL MOVEMENT: Pain with bowel movement: No Type of bowel movement:Type (Bristol Stool Scale) 3-4, Frequency 1x/day, Strain no, and Splinting no Fully empty rectum: Yes:   Leakage: No Pads: No Fiber supplement/laxative Yes - trying to increase her fiber and protein intake naturally with foods   URINATION: Pain with urination: No Fully empty bladder: Yes:   Stream: Strong Urgency: Yes  Frequency: within normal limits - gets up rarely at night to void  Leakage: Urge to void, Coughing, Sneezing, and Laughing Pads: Yes: wears pads if she feels like she needs it   INTERCOURSE: is currently trying to become more sexually active   Ability to have vaginal penetration No  Pain with intercourse: Initial Penetration DrynessYes  Climax: yes Marinoff Scale: 1/3  PREGNANCY: Vaginal deliveries 0 C-section deliveries 1 Currently pregnant No  PROLAPSE: None  OBJECTIVE:  Note: Objective measures were completed at Evaluation unless otherwise noted.  PATIENT SURVEYS:   PFIQ-7: 16  COGNITION: Overall cognitive status: Within functional limits for tasks assessed     SENSATION: Light touch: Appears intact  LUMBAR SPECIAL TESTS:  Single leg stance test: Positive  FUNCTIONAL TESTS:  Squat: bilateral dynamic knee valgus with loading, general lumbopelvic stiffness  present  GAIT: Assistive device utilized: None Comments: moderate trendelenburg gait pattern with ambulation  POSTURE: rounded shoulders, forward head, and increased thoracic kyphosis  LUMBARAROM/PROM:  A/PROM A/PROM  eval  Flexion 25% limited  Extension 25% limited  Right lateral flexion 25% limited  Left lateral flexion 25% limited  Right rotation 25% limited  Left rotation 25% limited   (Blank rows = not tested)  LOWER EXTREMITY ROM: within functional limits   Active ROM Right eval Left eval  Hip flexion    Hip extension    Hip abduction    Hip adduction    Hip internal rotation    Hip external rotation    Knee flexion    Knee extension    Ankle dorsiflexion    Ankle plantarflexion    Ankle inversion    Ankle eversion     (Blank rows = not tested)  LOWER EXTREMITY MMT: 4/5 bilateral knees and hips grossly  PALPATION:   General: no significant tenderness to palpation of bilateral glutes or hip flexors   Pelvic Alignment: within functional limits   Abdominal: abdominal breathing present at rest with sufficient lower rib excursion                External Perineal Exam: dryness present with sufficient clitoral hood mobility                              Internal Pelvic Floor: Patient fully consents to today's internal examination vaginally. She had no pain with palpation of superficial or deep pelvic floor musculature. With inhalation, patient's pelvic floor lengthens naturally, however she is unable to contract her pelvic floor beyond a small flicker of a contraction. With exhalation, this contraction strength increased to 3/5. She demonstrates a strong lack of coordination in the pelvic floor, but with tactile/verbal cueing form PT, she was able to achieve more active range of motion of the musculature.   Patient confirms identification and approves PT to assess internal pelvic floor and treatment Yes No emotional/communication barriers or cognitive limitation.  Patient is motivated to learn. Patient  understands and agrees with treatment goals and plan. PT explains patient will be examined in standing, sitting, and lying down to see how their muscles and joints work. When they are ready, they will be asked to remove their underwear so PT can examine their perineum. The patient is also given the option of providing their own chaperone as one is not provided in our facility. The patient also has the right and is explained the right to defer or refuse any part of the evaluation or treatment including the internal exam. With the patient's consent, PT will use one gloved finger to gently assess the muscles of the pelvic floor, seeing how well it contracts and relaxes and if there is muscle symmetry. After, the patient will get dressed and PT and patient will discuss exam findings and plan of care. PT and patient discuss plan of care, schedule, attendance policy and HEP activities.  PELVIC MMT:   MMT eval  Vaginal 3/5, 0 quick flicks, 0 second hold  Internal Anal Sphincter   External Anal Sphincter   Puborectalis   Diastasis Recti   (Blank rows = not tested)       TONE: Decreased throughout superficial and deep pelvic floor musculature   PROLAPSE: N/A  TODAY'S TREATMENT:                                                                                                                              DATE:   EVAL 06/24/23: Examination completed, findings reviewed, pt educated on POC, HEP, and self care. Pt motivated to participate in PT and agreeable to attempt recommendations.   Neuro re-ed: Hooklying diaphragmatic breathing + pelvic floor lengthening with inhalation and shortening with exhalation 3x10  Hooklying pelvic floor quick flicks + diaphragmatic breathing 2x10  Self care: Blow as you go technique, relative anatomy and the connection between the diaphragm and pelvic floor musculature, pelvic floor active range of motion and how to achieve that    PATIENT EDUCATION:  Education details: Blow as you go technique, relative anatomy and the connection between the diaphragm and pelvic floor musculature, pelvic floor active range of motion and how to achieve that  Person educated: Patient Education method: Explanation, Demonstration, Tactile cues, Verbal cues, and Handouts Education comprehension: verbalized understanding, returned demonstration, verbal cues required, tactile cues required, and needs further education  HOME EXERCISE PROGRAM: Access Code: [email or web address] URL: [email or web address] Date: 06/24/2023 Prepared by: Robbin Chill  Exercises - Supine Pelvic Floor Contraction  - 1 x daily - 7 x weekly - 3 sets - 10 reps - Quick Flick Pelvic Floor Contractions in Hooklying  - 1 x daily - 7 x weekly - 3 sets - 10 reps  ASSESSMENT:  CLINICAL IMPRESSION: Patient is a 68 y.o. female  who was seen today for physical therapy evaluation and treatment for stress urinary incontinence. This began to increase in intensity in March when she had a cold and was coughing frequently. This leakage is  small in amount, not too large, and is exacerbated with urgency/coughing/sneezing/laughing. Patient fully consents to today's internal examination vaginally. She had no pain with palpation of superficial or deep pelvic floor musculature. With inhalation, patient's pelvic floor lengthens naturally, however she is unable to contract her pelvic floor beyond a small flicker of a contraction. With exhalation, this contraction strength increased to 3/5. She demonstrates a strong lack of coordination in the pelvic floor, but with tactile/verbal cueing form PT, she was able to achieve more active range of motion of the musculature. Introduction to pelvic floor AROM went very well and patient plans to practice this more independently. Overall, patient tolerated session well and Pt would benefit from additional PT to further address deficits.    OBJECTIVE  IMPAIRMENTS: decreased coordination, decreased endurance, decreased mobility, decreased ROM, and decreased strength.   ACTIVITY LIMITATIONS: continence  PARTICIPATION LIMITATIONS: N/A  PERSONAL FACTORS: Age, Past/current experiences, and Time since onset of injury/illness/exacerbation are also affecting patient's functional outcome.   REHAB POTENTIAL: Good  CLINICAL DECISION MAKING: Stable/uncomplicated  EVALUATION COMPLEXITY: Low   GOALS: Goals reviewed with patient? Yes  SHORT TERM GOALS: Target date: 07/22/2023  Pt will be independent with HEP.  Baseline: Goal status: INITIAL  2.  Pt will be independent with diaphragmatic breathing and down training activities in order to improve pelvic floor relaxation. Baseline:  Goal status: INITIAL  3.  Pt will be independent with the knack, urge suppression technique, and double voiding in order to improve bladder habits and decrease urinary incontinence.   Baseline:  Goal status: INITIAL  4.  Pt will be able to correctly perform diaphragmatic breathing and appropriate pressure management in order to prevent worsening vaginal wall laxity and improve pelvic floor A/ROM.  Baseline:  Goal status: INITIAL  LONG TERM GOALS: Target date: 12/24/2023  Pt will be independent with advanced HEP.  Baseline:  Goal status: INITIAL  2.  Pt to demonstrate improved coordination of pelvic floor and breathing mechanics with 10# squat with appropriate synergistic patterns to decrease pain and leakage at least 75% of the time for improved ability to complete a 30 minute workout with strain at pelvic floor and symptoms.   Baseline:  Goal status: INITIAL  3.  Pt will have 75% reduced leakage during a typical day to suggest improved overall pelvic floor control and urinary management techniques. Baseline:  Goal status: INITIAL  4.  Pt will demonstrate 3+/5 pelvic floor muscle strength with appropriate coordination in order to decrease urinary  incontinence, urgency and frequency.   Baseline:  Goal status: INITIAL  PLAN:  PT FREQUENCY: 1-2x/week  PT DURATION: 12 weeks  PLANNED INTERVENTIONS: 97110-Therapeutic exercises, 97530- Therapeutic activity, 97112- Neuromuscular re-education, 97535- Self Care, 16109- Manual therapy, Patient/Family education, Balance training, Taping, Joint mobilization, Spinal mobilization, Scar mobilization, Cryotherapy, and Moist heat  PLAN FOR NEXT SESSION: continued pelvic floor AROM in seated, introduce hip and core strengthening, introduce knack technique and urge drill  Marni Sins, PT 06/24/2023, 5:16 PM

## 2023-08-01 ENCOUNTER — Ambulatory Visit: Admitting: Physical Therapy

## 2023-08-05 ENCOUNTER — Ambulatory Visit: Admitting: Physical Therapy

## 2023-08-12 ENCOUNTER — Ambulatory Visit: Attending: Family Medicine | Admitting: Physical Therapy

## 2023-08-12 DIAGNOSIS — M6281 Muscle weakness (generalized): Secondary | ICD-10-CM | POA: Diagnosis present

## 2023-08-12 DIAGNOSIS — R293 Abnormal posture: Secondary | ICD-10-CM | POA: Insufficient documentation

## 2023-08-12 DIAGNOSIS — R279 Unspecified lack of coordination: Secondary | ICD-10-CM | POA: Diagnosis present

## 2023-08-12 NOTE — Therapy (Signed)
 OUTPATIENT PHYSICAL THERAPY FEMALE PELVIC TREATMENT   Patient Name: Makayla Wong MRN: [id number] DOB:06/13/1955, 68 y.o., female Today's Date: 08/12/2023  END OF SESSION:  PT End of Session - 08/12/23 1051     Visit Number 2    Number of Visits 8    Date for PT Re-Evaluation 07/22/23    Authorization Type Medicare/[employer]    PT Start Time 1015    PT Stop Time 1055    PT Time Calculation (min) 40 min    Activity Tolerance Patient tolerated treatment well    Behavior During Therapy WFL for tasks assessed/performed           Past Medical History:  Diagnosis Date   Anxiety    Depression    Dysthymia    GERD (gastroesophageal reflux disease)    Hiatal hernia with GERD    Hypercholesterolemia    Hypertension    Obesity    Pre-diabetes    Vitamin D deficiency    No past surgical history on file. Patient Active Problem List   Diagnosis Date Noted   Acid reflux 05/13/2023   Hypertensive disorder 05/13/2023   Obesity 05/13/2023   Osteopenia 05/13/2023   Polyp of cervix 05/13/2023   Seborrheic keratoses 05/13/2023   Uterine leiomyoma 05/13/2023   Preoperative clearance 06/09/2018    PCP: Regino Slater, MD   REFERRING PROVIDER: Regino Slater, MD   REFERRING DIAG: N39.3 (ICD-10-CM) - Stress incontinence (female) (female)  THERAPY DIAG:  Muscle weakness (generalized)  Unspecified lack of coordination  Abnormal posture  Rationale for Evaluation and Treatment: Rehabilitation  ONSET DATE: March of this year is when it became intense   SUBJECTIVE:                                                                                                                                                                                           SUBJECTIVE STATEMENT: No questions after last visit. She has been trying to more mindful of picking up the blueberry. 0/10 pain. No leakage this morning, but this past week she has had some leakage with coughing. Single coughs don't cause  any leakage, but coughing fits sometimes do.  No bowel related concerns to report.   From eval: Patient reports to PFPT with stress urinary incontinence. She had a cold in march that exacerbated this. No pelvic pain to report.  Fluid intake: at least 40 oz water daily, fresh OJ in the mornings, cup of hot tea in the mornings (caffeine)   PAIN:  Are you having pain? No NPRS scale: 0/10  PRECAUTIONS: None  RED FLAGS: None   WEIGHT BEARING RESTRICTIONS: No  FALLS:  Has patient fallen in last 6 months? 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; works in the yard; enjoys walking   PLOF: Independent with basic ADLs  PATIENT GOALS: to control the leakage from happening   PERTINENT HISTORY:  C section 1991, 2 total hip replacements (2020, 2022)  Hiatal hernia   BOWEL MOVEMENT: Pain with bowel movement: No Type of bowel movement:Type (Bristol Stool Scale) 3-4, Frequency 1x/day, Strain no, and Splinting no Fully empty rectum: Yes:   Leakage: No Pads: No Fiber supplement/laxative Yes - trying to increase her fiber and protein intake naturally with foods   URINATION: Pain with urination: No Fully empty bladder: Yes:   Stream: Strong Urgency: Yes  Frequency: within normal limits - gets up rarely at night to void  Leakage: Urge to void, Coughing, Sneezing, and Laughing Pads: Yes: wears pads if she feels like she needs it   INTERCOURSE: is currently trying to become more sexually active   Ability to have vaginal penetration No  Pain with intercourse: Initial Penetration DrynessYes  Climax: yes Marinoff Scale: 1/3  PREGNANCY: Vaginal deliveries 0 C-section deliveries 1 Currently pregnant No  PROLAPSE: None  OBJECTIVE:  Note: Objective measures were completed at Evaluation unless otherwise noted.  PATIENT SURVEYS:   PFIQ-7:  16  COGNITION: Overall cognitive status: Within functional limits for tasks assessed     SENSATION: Light touch: Appears intact  LUMBAR SPECIAL TESTS:  Single leg stance test: Positive  FUNCTIONAL TESTS:  Squat: bilateral dynamic knee valgus with loading, general lumbopelvic stiffness present  GAIT: Assistive device utilized: None Comments: moderate trendelenburg gait pattern with ambulation  POSTURE: rounded shoulders, forward head, and increased thoracic kyphosis  LUMBARAROM/PROM:  A/PROM A/PROM  eval  Flexion 25% limited  Extension 25% limited  Right lateral flexion 25% limited  Left lateral flexion 25% limited  Right rotation 25% limited  Left rotation 25% limited   (Blank rows = not tested)  LOWER EXTREMITY ROM: within functional limits   Active ROM Right eval Left eval  Hip flexion    Hip extension    Hip abduction    Hip adduction    Hip internal rotation    Hip external rotation    Knee flexion    Knee extension    Ankle dorsiflexion    Ankle plantarflexion    Ankle inversion    Ankle eversion     (Blank rows = not tested)  LOWER EXTREMITY MMT: 4/5 bilateral knees and hips grossly  PALPATION:   General: no significant tenderness to palpation of bilateral glutes or hip flexors   Pelvic Alignment: within functional limits   Abdominal: abdominal breathing present at rest with sufficient lower rib excursion                External Perineal Exam: dryness present with sufficient clitoral hood mobility                              Internal Pelvic Floor: Patient fully consents to today's internal examination vaginally. She had no pain with palpation of superficial or deep pelvic floor musculature. With inhalation, patient's pelvic floor lengthens naturally, however she is unable to contract her pelvic floor beyond a small flicker of a contraction. With exhalation, this contraction strength increased to 3/5. She demonstrates a  strong lack of coordination  in the pelvic floor, but with tactile/verbal cueing form PT, she was able to achieve more active range of motion of the musculature.   Patient confirms identification and approves PT to assess internal pelvic floor and treatment Yes No emotional/communication barriers or cognitive limitation. Patient is motivated to learn. Patient understands and agrees with treatment goals and plan. PT explains patient will be examined in standing, sitting, and lying down to see how their muscles and joints work. When they are ready, they will be asked to remove their underwear so PT can examine their perineum. The patient is also given the option of providing their own chaperone as one is not provided in our facility. The patient also has the right and is explained the right to defer or refuse any part of the evaluation or treatment including the internal exam. With the patient's consent, PT will use one gloved finger to gently assess the muscles of the pelvic floor, seeing how well it contracts and relaxes and if there is muscle symmetry. After, the patient will get dressed and PT and patient will discuss exam findings and plan of care. PT and patient discuss plan of care, schedule, attendance policy and HEP activities.  PELVIC MMT:   MMT eval  Vaginal 3/5, 0 quick flicks, 0 second hold  Internal Anal Sphincter   External Anal Sphincter   Puborectalis   Diastasis Recti   (Blank rows = not tested)       TONE: Decreased throughout superficial and deep pelvic floor musculature   PROLAPSE: N/A  TODAY'S TREATMENT:                                                                                                                              DATE:   EVAL 06/24/23: Examination completed, findings reviewed, pt educated on POC, HEP, and self care. Pt motivated to participate in PT and agreeable to attempt recommendations.   Neuro re-ed: Hooklying diaphragmatic breathing + pelvic floor lengthening with inhalation and  shortening with exhalation 3x10  Hooklying pelvic floor quick flicks + diaphragmatic breathing 2x10  Self care: Blow as you go technique, relative anatomy and the connection between the diaphragm and pelvic floor musculature, pelvic floor active range of motion and how to achieve that   08/12/23: Neuro re-ed: seated diaphragmatic breathing + pelvic floor lengthening with inhalation and shortening with exhalation 3x10  seated pelvic floor quick flicks + diaphragmatic breathing 2x10  Therapeutic exercise: Sit to stand + adductor ball squeeze + diaphragmatic breathing 2x8  Bridge + adductor ball squeeze + diaphragmatic breathing 2x8  Sidelying clamshell + reverse clamshell + diaphragmatic breathing 2x10  Lower trunk rotations + diaphragmatic breathing 2x10  Pretzel piriformis stretch + diaphragmatic breathing 2x31min  Self care: Blow as you go technique, relative anatomy and the connection between the diaphragm and pelvic floor musculature, pelvic floor active range of motion and how to achieve that  Knack technique for stress  urinary incontinence   PATIENT EDUCATION:  Education details: Blow as you go technique, relative anatomy and the connection between the diaphragm and pelvic floor musculature, pelvic floor active range of motion and how to achieve that  Person educated: Patient Education method: Explanation, Demonstration, Tactile cues, Verbal cues, and Handouts Education comprehension: verbalized understanding, returned demonstration, verbal cues required, tactile cues required, and needs further education  HOME EXERCISE PROGRAM: Access Code: [email or web address] URL: [email or web address] Date: 08/12/2023 Prepared by: Celena Domino  Exercises - Seated Pelvic Floor Contraction  - 1 x daily - 7 x weekly - 2 sets - 10 reps - Seated Quick Flick Pelvic Floor Contractions  - 1 x daily - 7 x weekly - 2 sets - 10 reps - Sit to Stand with Mercer Between Knees  - 1 x daily - 7 x weekly - 2  sets - 8-10 reps - Supine Bridge with Mini Swiss Ball Between Knees  - 1 x daily - 7 x weekly - 2 sets - 10 reps - Clamshell  - 1 x daily - 7 x weekly - 2 sets - 10 reps - Sidelying Reverse Clamshell  - 1 x daily - 7 x weekly - 2 sets - 10 reps - Supine Figure 4 Piriformis Stretch  - 1 x daily - 7 x weekly - 2 sets - hold - Supine Lower Trunk Rotation  - 1 x daily - 7 x weekly - 2 sets - 10 reps  ASSESSMENT:  CLINICAL IMPRESSION: Patient is a 68 y.o. female  who was seen today for physical therapy treatment for stress urinary incontinence.Leakage has been better since first session, still leaking with coughing. Knack technique introduced for stress urinary incontinence management and patient introduced to hip and core strengthening. She tolerated this very well with minimal verbal cueing from PT. Overall, patient tolerated session well and Pt would benefit from additional PT to further address deficits.    OBJECTIVE IMPAIRMENTS: decreased coordination, decreased endurance, decreased mobility, decreased ROM, and decreased strength.   ACTIVITY LIMITATIONS: continence  PARTICIPATION LIMITATIONS: N/A  PERSONAL FACTORS: Age, Past/current experiences, and Time since onset of injury/illness/exacerbation are also affecting patient's functional outcome.   REHAB POTENTIAL: Good  CLINICAL DECISION MAKING: Stable/uncomplicated  EVALUATION COMPLEXITY: Low   GOALS: Goals reviewed with patient? Yes  SHORT TERM GOALS: Target date: 07/22/2023  Pt will be independent with HEP.  Baseline: Goal status: INITIAL  2.  Pt will be independent with diaphragmatic breathing and down training activities in order to improve pelvic floor relaxation. Baseline:  Goal status: INITIAL  3.  Pt will be independent with the knack, urge suppression technique, and double voiding in order to improve bladder habits and decrease urinary incontinence.   Baseline:  Goal status: INITIAL  4.  Pt will be able to  correctly perform diaphragmatic breathing and appropriate pressure management in order to prevent worsening vaginal wall laxity and improve pelvic floor A/ROM.  Baseline:  Goal status: INITIAL  LONG TERM GOALS: Target date: 12/24/2023  Pt will be independent with advanced HEP.  Baseline:  Goal status: INITIAL  2.  Pt to demonstrate improved coordination of pelvic floor and breathing mechanics with 10# squat with appropriate synergistic patterns to decrease pain and leakage at least 75% of the time for improved ability to complete a 30 minute workout with strain at pelvic floor and symptoms.   Baseline:  Goal status: INITIAL  3.  Pt will have 75% reduced leakage during a typical day to suggest improved overall  pelvic floor control and urinary management techniques. Baseline:  Goal status: INITIAL  4.  Pt will demonstrate 3+/5 pelvic floor muscle strength with appropriate coordination in order to decrease urinary incontinence, urgency and frequency.   Baseline:  Goal status: INITIAL  PLAN:  PT FREQUENCY: 1-2x/week  PT DURATION: 12 weeks  PLANNED INTERVENTIONS: 97110-Therapeutic exercises, 97530- Therapeutic activity, 97112- Neuromuscular re-education, 97535- Self Care, [id number]- Manual therapy, Patient/Family education, Balance training, Taping, Joint mobilization, Spinal mobilization, Scar mobilization, Cryotherapy, and Moist heat  PLAN FOR NEXT SESSION: continued pelvic floor AROM in seated, introduce hip and core strengthening, introduce knack technique and urge drill  Celena JAYSON Domino, PT 08/12/2023, 10:56 AM

## 2023-08-19 ENCOUNTER — Ambulatory Visit: Admitting: Physical Therapy

## 2023-08-19 DIAGNOSIS — R279 Unspecified lack of coordination: Secondary | ICD-10-CM

## 2023-08-19 DIAGNOSIS — M6281 Muscle weakness (generalized): Secondary | ICD-10-CM | POA: Diagnosis not present

## 2023-08-19 NOTE — Therapy (Signed)
 OUTPATIENT PHYSICAL THERAPY FEMALE PELVIC TREATMENT   Patient Name: Makayla Wong MRN: [id number] DOB:28-Jun-1955, 68 y.o., female Today's Date: 08/19/2023  END OF SESSION:  PT End of Session - 08/19/23 1522     Visit Number 3    Number of Visits 8    Date for PT Re-Evaluation 07/22/23    Authorization Type Medicare/[employer]    PT Start Time 0245    PT Stop Time 0325    PT Time Calculation (min) 40 min    Activity Tolerance Patient tolerated treatment well    Behavior During Therapy WFL for tasks assessed/performed            Past Medical History:  Diagnosis Date   Anxiety    Depression    Dysthymia    GERD (gastroesophageal reflux disease)    Hiatal hernia with GERD    Hypercholesterolemia    Hypertension    Obesity    Pre-diabetes    Vitamin D deficiency    No past surgical history on file. Patient Active Problem List   Diagnosis Date Noted   Acid reflux 05/13/2023   Hypertensive disorder 05/13/2023   Obesity 05/13/2023   Osteopenia 05/13/2023   Polyp of cervix 05/13/2023   Seborrheic keratoses 05/13/2023   Uterine leiomyoma 05/13/2023   Preoperative clearance 06/09/2018    PCP: Regino Slater, MD   REFERRING PROVIDER: Regino Slater, MD   REFERRING DIAG: N39.3 (ICD-10-CM) - Stress incontinence (female) (female)  THERAPY DIAG:  Muscle weakness (generalized)  Unspecified lack of coordination  Rationale for Evaluation and Treatment: Rehabilitation  ONSET DATE: March of this year is when it became intense   SUBJECTIVE:                                                                                                                                                                                           SUBJECTIVE STATEMENT: No questions after last visit. 0/10 pain today. Urinary leakage has been doing much better - she has been trying the knack technique for stress urinary incontinence and it is helping. No major urge incontinence. No bowel related issues  whatsoever.   From eval: Patient reports to PFPT with stress urinary incontinence. She had a cold in march that exacerbated this. No pelvic pain to report.  Fluid intake: at least 40 oz water daily, fresh OJ in the mornings, cup of hot tea in the mornings (caffeine)   PAIN:  Are you having pain? No NPRS scale: 0/10  PRECAUTIONS: None  RED FLAGS: None   WEIGHT BEARING RESTRICTIONS: No  FALLS:  Has patient fallen in last 6 months? 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; works in the yard; enjoys walking   PLOF: Independent with basic ADLs  PATIENT GOALS: to control the leakage from happening   PERTINENT HISTORY:  C section 1991, 2 total hip replacements (2020, 2022)  Hiatal hernia   BOWEL MOVEMENT: Pain with bowel movement: No Type of bowel movement:Type (Bristol Stool Scale) 3-4, Frequency 1x/day, Strain no, and Splinting no Fully empty rectum: Yes:   Leakage: No Pads: No Fiber supplement/laxative Yes - trying to increase her fiber and protein intake naturally with foods   URINATION: Pain with urination: No Fully empty bladder: Yes:   Stream: Strong Urgency: Yes  Frequency: within normal limits - gets up rarely at night to void  Leakage: Urge to void, Coughing, Sneezing, and Laughing Pads: Yes: wears pads if she feels like she needs it   INTERCOURSE: is currently trying to become more sexually active   Ability to have vaginal penetration No  Pain with intercourse: Initial Penetration DrynessYes  Climax: yes Marinoff Scale: 1/3  PREGNANCY: Vaginal deliveries 0 C-section deliveries 1 Currently pregnant No  PROLAPSE: None  OBJECTIVE:  Note: Objective measures were completed at Evaluation unless otherwise noted.  PATIENT SURVEYS:   PFIQ-7: 16  COGNITION: Overall cognitive status: Within functional limits for tasks  assessed     SENSATION: Light touch: Appears intact  LUMBAR SPECIAL TESTS:  Single leg stance test: Positive  FUNCTIONAL TESTS:  Squat: bilateral dynamic knee valgus with loading, general lumbopelvic stiffness present  GAIT: Assistive device utilized: None Comments: moderate trendelenburg gait pattern with ambulation  POSTURE: rounded shoulders, forward head, and increased thoracic kyphosis  LUMBARAROM/PROM:  A/PROM A/PROM  eval  Flexion 25% limited  Extension 25% limited  Right lateral flexion 25% limited  Left lateral flexion 25% limited  Right rotation 25% limited  Left rotation 25% limited   (Blank rows = not tested)  LOWER EXTREMITY ROM: within functional limits   Active ROM Right eval Left eval  Hip flexion    Hip extension    Hip abduction    Hip adduction    Hip internal rotation    Hip external rotation    Knee flexion    Knee extension    Ankle dorsiflexion    Ankle plantarflexion    Ankle inversion    Ankle eversion     (Blank rows = not tested)  LOWER EXTREMITY MMT: 4/5 bilateral knees and hips grossly  PALPATION:   General: no significant tenderness to palpation of bilateral glutes or hip flexors   Pelvic Alignment: within functional limits   Abdominal: abdominal breathing present at rest with sufficient lower rib excursion                External Perineal Exam: dryness present with sufficient clitoral hood mobility                              Internal Pelvic Floor: Patient fully consents to today's internal examination vaginally. She had no pain with palpation of superficial or deep pelvic floor musculature. With inhalation, patient's pelvic floor lengthens naturally, however she is unable to contract her pelvic floor beyond a small flicker of a contraction. With exhalation, this contraction strength increased to 3/5. She demonstrates a strong lack of coordination in the pelvic floor, but with tactile/verbal cueing form  PT, she was able to  achieve more active range of motion of the musculature.   Patient confirms identification and approves PT to assess internal pelvic floor and treatment Yes No emotional/communication barriers or cognitive limitation. Patient is motivated to learn. Patient understands and agrees with treatment goals and plan. PT explains patient will be examined in standing, sitting, and lying down to see how their muscles and joints work. When they are ready, they will be asked to remove their underwear so PT can examine their perineum. The patient is also given the option of providing their own chaperone as one is not provided in our facility. The patient also has the right and is explained the right to defer or refuse any part of the evaluation or treatment including the internal exam. With the patient's consent, PT will use one gloved finger to gently assess the muscles of the pelvic floor, seeing how well it contracts and relaxes and if there is muscle symmetry. After, the patient will get dressed and PT and patient will discuss exam findings and plan of care. PT and patient discuss plan of care, schedule, attendance policy and HEP activities.  PELVIC MMT:   MMT eval  Vaginal 3/5, 0 quick flicks, 0 second hold  Internal Anal Sphincter   External Anal Sphincter   Puborectalis   Diastasis Recti   (Blank rows = not tested)       TONE: Decreased throughout superficial and deep pelvic floor musculature   PROLAPSE: N/A  TODAY'S TREATMENT:                                                                                                                              DATE:   EVAL 06/24/23: Examination completed, findings reviewed, pt educated on POC, HEP, and self care. Pt motivated to participate in PT and agreeable to attempt recommendations.   Neuro re-ed: Hooklying diaphragmatic breathing + pelvic floor lengthening with inhalation and shortening with exhalation 3x10  Hooklying pelvic floor quick flicks +  diaphragmatic breathing 2x10  Self care: Blow as you go technique, relative anatomy and the connection between the diaphragm and pelvic floor musculature, pelvic floor active range of motion and how to achieve that   08/12/23: Neuro re-ed: seated diaphragmatic breathing + pelvic floor lengthening with inhalation and shortening with exhalation 3x10  seated pelvic floor quick flicks + diaphragmatic breathing 2x10  Therapeutic exercise: Sit to stand + adductor ball squeeze + diaphragmatic breathing 2x8  Bridge + adductor ball squeeze + diaphragmatic breathing 2x8  Sidelying clamshell + reverse clamshell + diaphragmatic breathing 2x10  Lower trunk rotations + diaphragmatic breathing 2x10  Pretzel piriformis stretch + diaphragmatic breathing 2x52min  Self care: Blow as you go technique, relative anatomy and the connection between the diaphragm and pelvic floor musculature, pelvic floor active range of motion and how to achieve that  Knack technique for stress urinary incontinence   08/19/23: Neuro re-ed: standing diaphragmatic breathing + pelvic floor  lengthening with inhalation and shortening with exhalation 3x10  seated pelvic floor quick flicks + diaphragmatic breathing 2x10  Bird dog against countertop + diaphragmatic breathing 2x10  Standing pull down + march + diaphragmatic breathing 2x10  Therapeutic exercise: Sit to stand + hip abduction (GTB) + diaphragmatic breathing 2x8  Bridge + hip abduction (GTB) + diaphragmatic breathing 2x8  Seated internal rotation (GTB) + ball squeeze + diaphragmatic breathing 2x10  Lower trunk rotations + diaphragmatic breathing 2x10  Pretzel piriformis stretch + diaphragmatic breathing 2x98min  Self care: Blow as you go technique, relative anatomy and the connection between the diaphragm and pelvic floor musculature, pelvic floor active range of motion and how to achieve that  Knack technique for stress urinary incontinence   PATIENT EDUCATION:  Education  details: Blow as you go technique, relative anatomy and the connection between the diaphragm and pelvic floor musculature, pelvic floor active range of motion and how to achieve that  Person educated: Patient Education method: Explanation, Demonstration, Tactile cues, Verbal cues, and Handouts Education comprehension: verbalized understanding, returned demonstration, verbal cues required, tactile cues required, and needs further education  HOME EXERCISE PROGRAM: Access Code: [email or web address] URL: [email or web address] Date: 08/19/2023 Prepared by: Celena Domino  Exercises - Standing Pelvic Floor Contraction  - 1 x daily - 7 x weekly - 2 sets - 10 reps - Seated Quick Flick Pelvic Floor Contractions  - 1 x daily - 7 x weekly - 2 sets - 10 reps - Seated Hip Internal Rotation with Ball and Resistance  - 1 x daily - 7 x weekly - 2 sets - 12 reps - Sit to Stand with Resistance Around Legs  - 1 x daily - 7 x weekly - 2 sets - 10 reps - Supine Bridge with Resistance Band  - 1 x daily - 7 x weekly - 2 sets - 10 reps - Bird Dog on Counter  - 1 x daily - 7 x weekly - 2 sets - 10 reps - Resistance Pulldown with March  - 1 x daily - 7 x weekly - 2 sets - 10 reps - Supine Figure 4 Piriformis Stretch  - 1 x daily - 7 x weekly - 2 sets - hold - Supine Lower Trunk Rotation  - 1 x daily - 7 x weekly - 2 sets - 10 reps  ASSESSMENT:  CLINICAL IMPRESSION: Patient is a 68 y.o. female  who was seen today for physical therapy treatment for stress urinary incontinence.Leakage has been better since first session, no leakage to report since last session. Gluteal and core strengthening progressed accordingly today. Traditional bird dog exercise was too challenging for patient so we modified this to the countertop version. She tolerated this very well with minimal verbal cueing from PT. Overall, patient tolerated session well and Pt would benefit from additional PT to further address deficits.    OBJECTIVE  IMPAIRMENTS: decreased coordination, decreased endurance, decreased mobility, decreased ROM, and decreased strength.   ACTIVITY LIMITATIONS: continence  PARTICIPATION LIMITATIONS: N/A  PERSONAL FACTORS: Age, Past/current experiences, and Time since onset of injury/illness/exacerbation are also affecting patient's functional outcome.   REHAB POTENTIAL: Good  CLINICAL DECISION MAKING: Stable/uncomplicated  EVALUATION COMPLEXITY: Low   GOALS: Goals reviewed with patient? Yes  SHORT TERM GOALS: Target date: 07/22/2023  Pt will be independent with HEP.  Baseline: Goal status: INITIAL  2.  Pt will be independent with diaphragmatic breathing and down training activities in order to improve pelvic floor relaxation. Baseline:  Goal status: INITIAL  3.  Pt will be independent with the knack, urge suppression technique, and double voiding in order to improve bladder habits and decrease urinary incontinence.   Baseline:  Goal status: INITIAL  4.  Pt will be able to correctly perform diaphragmatic breathing and appropriate pressure management in order to prevent worsening vaginal wall laxity and improve pelvic floor A/ROM.  Baseline:  Goal status: INITIAL  LONG TERM GOALS: Target date: 12/24/2023  Pt will be independent with advanced HEP.  Baseline:  Goal status: INITIAL  2.  Pt to demonstrate improved coordination of pelvic floor and breathing mechanics with 10# squat with appropriate synergistic patterns to decrease pain and leakage at least 75% of the time for improved ability to complete a 30 minute workout with strain at pelvic floor and symptoms.   Baseline:  Goal status: INITIAL  3.  Pt will have 75% reduced leakage during a typical day to suggest improved overall pelvic floor control and urinary management techniques. Baseline:  Goal status: INITIAL  4.  Pt will demonstrate 3+/5 pelvic floor muscle strength with appropriate coordination in order to decrease urinary  incontinence, urgency and frequency.   Baseline:  Goal status: INITIAL  PLAN:  PT FREQUENCY: 1-2x/week  PT DURATION: 12 weeks  PLANNED INTERVENTIONS: 97110-Therapeutic exercises, 97530- Therapeutic activity, 97112- Neuromuscular re-education, 97535- Self Care, [id number]- Manual therapy, Patient/Family education, Balance training, Taping, Joint mobilization, Spinal mobilization, Scar mobilization, Cryotherapy, and Moist heat  PLAN FOR NEXT SESSION: continued pelvic floor AROM in seated, introduce hip and core strengthening, introduce knack technique and urge drill  Celena JAYSON Domino, PT 08/19/2023, 3:22 PM

## 2023-08-26 ENCOUNTER — Encounter: Admitting: Physical Therapy

## 2023-09-02 ENCOUNTER — Ambulatory Visit: Admitting: Physical Therapy

## 2023-09-09 ENCOUNTER — Ambulatory Visit: Admitting: Physical Therapy

## 2023-09-23 ENCOUNTER — Ambulatory Visit: Attending: Family Medicine | Admitting: Physical Therapy

## 2023-09-23 DIAGNOSIS — R279 Unspecified lack of coordination: Secondary | ICD-10-CM | POA: Diagnosis present

## 2023-09-23 DIAGNOSIS — M6281 Muscle weakness (generalized): Secondary | ICD-10-CM | POA: Insufficient documentation

## 2023-09-23 DIAGNOSIS — R293 Abnormal posture: Secondary | ICD-10-CM | POA: Diagnosis present

## 2023-09-23 NOTE — Therapy (Signed)
 OUTPATIENT PHYSICAL THERAPY FEMALE PELVIC TREATMENT   Patient Name: Makayla Wong MRN: [id number] DOB:1955-11-29, 68 y.o., female Today's Date: 09/23/2023  END OF SESSION:  PT End of Session - 09/23/23 1533     Visit Number 4    Number of Visits 8    Date for PT Re-Evaluation 07/22/23    Authorization Type Medicare/[employer]    PT Start Time 0245    PT Stop Time 0330    PT Time Calculation (min) 45 min    Activity Tolerance Patient tolerated treatment well    Behavior During Therapy WFL for tasks assessed/performed             Past Medical History:  Diagnosis Date   Anxiety    Depression    Dysthymia    GERD (gastroesophageal reflux disease)    Hiatal hernia with GERD    Hypercholesterolemia    Hypertension    Obesity    Pre-diabetes    Vitamin D deficiency    No past surgical history on file. Patient Active Problem List   Diagnosis Date Noted   Acid reflux 05/13/2023   Hypertensive disorder 05/13/2023   Obesity 05/13/2023   Osteopenia 05/13/2023   Polyp of cervix 05/13/2023   Seborrheic keratoses 05/13/2023   Uterine leiomyoma 05/13/2023   Preoperative clearance 06/09/2018    PCP: Regino Slater, MD   REFERRING PROVIDER: Regino Slater, MD   REFERRING DIAG: N39.3 (ICD-10-CM) - Stress incontinence (female) (female)  THERAPY DIAG:  Muscle weakness (generalized)  Unspecified lack of coordination  Abnormal posture  Rationale for Evaluation and Treatment: Rehabilitation  ONSET DATE: March of this year is when it became intense   SUBJECTIVE:                                                                                                                                                                                           SUBJECTIVE STATEMENT: No questions after last visit. Urge drill helped a lot during her travels over the weekend. No urinary leakage on her trip. 0/10 pain today. No bower related concerns.   From eval: Patient reports to PFPT with  stress urinary incontinence. She had a cold in march that exacerbated this. No pelvic pain to report.  Fluid intake: at least 40 oz water daily, fresh OJ in the mornings, cup of hot tea in the mornings (caffeine)   PAIN:  Are you having pain? No NPRS scale: 0/10  PRECAUTIONS: None  RED FLAGS: None   WEIGHT BEARING RESTRICTIONS: No  FALLS:  Has patient fallen in last 6 months? 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; works in the yard; enjoys walking   PLOF: Independent with basic ADLs  PATIENT GOALS: to control the leakage from happening   PERTINENT HISTORY:  C section 1991, 2 total hip replacements (2020, 2022)  Hiatal hernia   BOWEL MOVEMENT: Pain with bowel movement: No Type of bowel movement:Type (Bristol Stool Scale) 3-4, Frequency 1x/day, Strain no, and Splinting no Fully empty rectum: Yes:   Leakage: No Pads: No Fiber supplement/laxative Yes - trying to increase her fiber and protein intake naturally with foods   URINATION: Pain with urination: No Fully empty bladder: Yes:   Stream: Strong Urgency: Yes  Frequency: within normal limits - gets up rarely at night to void  Leakage: Urge to void, Coughing, Sneezing, and Laughing Pads: Yes: wears pads if she feels like she needs it   INTERCOURSE: is currently trying to become more sexually active   Ability to have vaginal penetration No  Pain with intercourse: Initial Penetration DrynessYes  Climax: yes Marinoff Scale: 1/3  PREGNANCY: Vaginal deliveries 0 C-section deliveries 1 Currently pregnant No  PROLAPSE: None  OBJECTIVE:  Note: Objective measures were completed at Evaluation unless otherwise noted.  PATIENT SURVEYS:   PFIQ-7: 16  COGNITION: Overall cognitive status: Within functional limits for tasks assessed     SENSATION: Light touch: Appears  intact  LUMBAR SPECIAL TESTS:  Single leg stance test: Positive  FUNCTIONAL TESTS:  Squat: bilateral dynamic knee valgus with loading, general lumbopelvic stiffness present  GAIT: Assistive device utilized: None Comments: moderate trendelenburg gait pattern with ambulation  POSTURE: rounded shoulders, forward head, and increased thoracic kyphosis  LUMBARAROM/PROM:  A/PROM A/PROM  eval  Flexion 25% limited  Extension 25% limited  Right lateral flexion 25% limited  Left lateral flexion 25% limited  Right rotation 25% limited  Left rotation 25% limited   (Blank rows = not tested)  LOWER EXTREMITY ROM: within functional limits   Active ROM Right eval Left eval  Hip flexion    Hip extension    Hip abduction    Hip adduction    Hip internal rotation    Hip external rotation    Knee flexion    Knee extension    Ankle dorsiflexion    Ankle plantarflexion    Ankle inversion    Ankle eversion     (Blank rows = not tested)  LOWER EXTREMITY MMT: 4/5 bilateral knees and hips grossly  PALPATION:   General: no significant tenderness to palpation of bilateral glutes or hip flexors   Pelvic Alignment: within functional limits   Abdominal: abdominal breathing present at rest with sufficient lower rib excursion                External Perineal Exam: dryness present with sufficient clitoral hood mobility                              Internal Pelvic Floor: Patient fully consents to today's internal examination vaginally. She had no pain with palpation of superficial or deep pelvic floor musculature. With inhalation, patient's pelvic floor lengthens naturally, however she is unable to contract her pelvic floor beyond a small flicker of a contraction. With exhalation, this contraction strength increased to 3/5. She demonstrates a strong lack of coordination in the pelvic floor, but with tactile/verbal cueing form PT, she was able to achieve more  active range of motion of the  musculature.   Patient confirms identification and approves PT to assess internal pelvic floor and treatment Yes No emotional/communication barriers or cognitive limitation. Patient is motivated to learn. Patient understands and agrees with treatment goals and plan. PT explains patient will be examined in standing, sitting, and lying down to see how their muscles and joints work. When they are ready, they will be asked to remove their underwear so PT can examine their perineum. The patient is also given the option of providing their own chaperone as one is not provided in our facility. The patient also has the right and is explained the right to defer or refuse any part of the evaluation or treatment including the internal exam. With the patient's consent, PT will use one gloved finger to gently assess the muscles of the pelvic floor, seeing how well it contracts and relaxes and if there is muscle symmetry. After, the patient will get dressed and PT and patient will discuss exam findings and plan of care. PT and patient discuss plan of care, schedule, attendance policy and HEP activities.  PELVIC MMT:   MMT eval  Vaginal 3/5, 0 quick flicks, 0 second hold  Internal Anal Sphincter   External Anal Sphincter   Puborectalis   Diastasis Recti   (Blank rows = not tested)       TONE: Decreased throughout superficial and deep pelvic floor musculature   PROLAPSE: N/A  TODAY'S TREATMENT:                                                                                                                              DATE:   EVAL 06/24/23: Examination completed, findings reviewed, pt educated on POC, HEP, and self care. Pt motivated to participate in PT and agreeable to attempt recommendations.   Neuro re-ed: Hooklying diaphragmatic breathing + pelvic floor lengthening with inhalation and shortening with exhalation 3x10  Hooklying pelvic floor quick flicks + diaphragmatic breathing 2x10  Self care: Blow as  you go technique, relative anatomy and the connection between the diaphragm and pelvic floor musculature, pelvic floor active range of motion and how to achieve that   08/12/23: Neuro re-ed: seated diaphragmatic breathing + pelvic floor lengthening with inhalation and shortening with exhalation 3x10  seated pelvic floor quick flicks + diaphragmatic breathing 2x10  Therapeutic exercise: Sit to stand + adductor ball squeeze + diaphragmatic breathing 2x8  Bridge + adductor ball squeeze + diaphragmatic breathing 2x8  Sidelying clamshell + reverse clamshell + diaphragmatic breathing 2x10  Lower trunk rotations + diaphragmatic breathing 2x10  Pretzel piriformis stretch + diaphragmatic breathing 2x64min  Self care: Blow as you go technique, relative anatomy and the connection between the diaphragm and pelvic floor musculature, pelvic floor active range of motion and how to achieve that  Knack technique for stress urinary incontinence   08/19/23: Neuro re-ed: standing diaphragmatic breathing + pelvic floor lengthening with inhalation and shortening with exhalation  3x10  seated pelvic floor quick flicks + diaphragmatic breathing 2x10  Bird dog against countertop + diaphragmatic breathing 2x10  Standing pull down + march + diaphragmatic breathing 2x10  Therapeutic exercise: Sit to stand + hip abduction (GTB) + diaphragmatic breathing 2x8  Bridge + hip abduction (GTB) + diaphragmatic breathing 2x8  Seated internal rotation (GTB) + ball squeeze + diaphragmatic breathing 2x10  Lower trunk rotations + diaphragmatic breathing 2x10  Pretzel piriformis stretch + diaphragmatic breathing 2x58min  Self care: Blow as you go technique, relative anatomy and the connection between the diaphragm and pelvic floor musculature, pelvic floor active range of motion and how to achieve that  Knack technique for stress urinary incontinence   09/23/23: Bridge + hip abduction (black TB) + diaphragmatic breathing 2x10   Seated internal rotation (blackTB) + ball squeeze + diaphragmatic breathing 2x10  Sit to stand + black TB + 5# KB + diaphragmatic breathing 2x10  Squat + black TB + 5# KB + diaphragmatic breathing 2x10  Standing alternating march/overhead press holding 2# weights + diaphragmatic breathing 2x10   PATIENT EDUCATION:  Education details: Blow as you go technique, relative anatomy and the connection between the diaphragm and pelvic floor musculature, pelvic floor active range of motion and how to achieve that  Person educated: Patient Education method: Explanation, Demonstration, Tactile cues, Verbal cues, and Handouts Education comprehension: verbalized understanding, returned demonstration, verbal cues required, tactile cues required, and needs further education  HOME EXERCISE PROGRAM: Access Code: [email or web address] URL: [email or web address] Date: 09/23/2023 Prepared by: Celena Domino  Exercises - Standing Pelvic Floor Contraction  - 1 x daily - 7 x weekly - 2 sets - 10 reps - Seated Quick Flick Pelvic Floor Contractions  - 1 x daily - 7 x weekly - 2 sets - 10 reps - Seated Hip Internal Rotation with Ball and Resistance  - 1 x daily - 7 x weekly - 2 sets - 12 reps - Squat with Resistance at Thighs  - 1 x daily - 7 x weekly - 2 sets - 10 reps - Supine Bridge with Resistance Band  - 1 x daily - 7 x weekly - 2 sets - 10 reps - Bird Dog on Counter  - 1 x daily - 7 x weekly - 2 sets - 10 reps - Resistance Pulldown with March  - 1 x daily - 7 x weekly - 2 sets - 10 reps - Standing March with Alternating [facility]  - 1 x daily - 7 x weekly - 2 sets - 10 reps - Supine Figure 4 Piriformis Stretch  - 1 x daily - 7 x weekly - 2 sets - hold - Supine Lower Trunk Rotation  - 1 x daily - 7 x weekly - 2 sets - 10 reps  ASSESSMENT:  CLINICAL IMPRESSION: Patient is a 68 y.o. female  who was seen today for physical therapy treatment for stress urinary incontinence.Leakage has been better  since first session, no leakage to report since last session. Gluteal and core strengthening progressed accordingly today. All exercises progressed in intensity and repetitions today. She tolerated this very well with minimal verbal cueing from PT. Overall, patient tolerated session well and Pt would benefit from additional PT to further address deficits.    OBJECTIVE IMPAIRMENTS: decreased coordination, decreased endurance, decreased mobility, decreased ROM, and decreased strength.   ACTIVITY LIMITATIONS: continence  PARTICIPATION LIMITATIONS: N/A  PERSONAL FACTORS: Age, Past/current experiences, and Time since onset of injury/illness/exacerbation are also affecting patient's functional  outcome.   REHAB POTENTIAL: Good  CLINICAL DECISION MAKING: Stable/uncomplicated  EVALUATION COMPLEXITY: Low   GOALS: Goals reviewed with patient? Yes  SHORT TERM GOALS: Target date: 07/22/2023  Pt will be independent with HEP.  Baseline: Goal status: INITIAL  2.  Pt will be independent with diaphragmatic breathing and down training activities in order to improve pelvic floor relaxation. Baseline:  Goal status: INITIAL  3.  Pt will be independent with the knack, urge suppression technique, and double voiding in order to improve bladder habits and decrease urinary incontinence.   Baseline:  Goal status: INITIAL  4.  Pt will be able to correctly perform diaphragmatic breathing and appropriate pressure management in order to prevent worsening vaginal wall laxity and improve pelvic floor A/ROM.  Baseline:  Goal status: INITIAL  LONG TERM GOALS: Target date: 12/24/2023  Pt will be independent with advanced HEP.  Baseline:  Goal status: INITIAL  2.  Pt to demonstrate improved coordination of pelvic floor and breathing mechanics with 10# squat with appropriate synergistic patterns to decrease pain and leakage at least 75% of the time for improved ability to complete a 30 minute workout with strain  at pelvic floor and symptoms.   Baseline:  Goal status: INITIAL  3.  Pt will have 75% reduced leakage during a typical day to suggest improved overall pelvic floor control and urinary management techniques. Baseline:  Goal status: INITIAL  4.  Pt will demonstrate 3+/5 pelvic floor muscle strength with appropriate coordination in order to decrease urinary incontinence, urgency and frequency.   Baseline:  Goal status: INITIAL  PLAN:  PT FREQUENCY: 1-2x/week  PT DURATION: 12 weeks  PLANNED INTERVENTIONS: 97110-Therapeutic exercises, 97530- Therapeutic activity, 97112- Neuromuscular re-education, 97535- Self Care, [id number]- Manual therapy, Patient/Family education, Balance training, Taping, Joint mobilization, Spinal mobilization, Scar mobilization, Cryotherapy, and Moist heat  PLAN FOR NEXT SESSION: continued pelvic floor AROM in seated, introduce hip and core strengthening, introduce knack technique and urge drill  Celena JAYSON Domino, PT 09/23/2023, 3:34 PM

## 2023-10-14 ENCOUNTER — Ambulatory Visit: Attending: Family Medicine | Admitting: Physical Therapy

## 2023-10-14 DIAGNOSIS — M6281 Muscle weakness (generalized): Secondary | ICD-10-CM | POA: Insufficient documentation

## 2023-10-14 DIAGNOSIS — R293 Abnormal posture: Secondary | ICD-10-CM | POA: Insufficient documentation

## 2023-10-14 DIAGNOSIS — R279 Unspecified lack of coordination: Secondary | ICD-10-CM | POA: Insufficient documentation

## 2023-10-14 NOTE — Therapy (Signed)
 OUTPATIENT PHYSICAL THERAPY FEMALE PELVIC TREATMENT   Patient Name: Makayla Wong MRN: [id number] DOB:[birth year], 68 y.o., female Today's Date: 10/14/2023  END OF SESSION:  PT End of Session - 10/14/23 1322     Visit Number 5    Number of Visits 8    Date for Recertification  07/22/23    Authorization Type Medicare/[employer]    PT Start Time 1230    PT Stop Time 1320    PT Time Calculation (min) 50 min    Activity Tolerance Patient tolerated treatment well    Behavior During Therapy WFL for tasks assessed/performed              Past Medical History:  Diagnosis Date   Anxiety    Depression    Dysthymia    GERD (gastroesophageal reflux disease)    Hiatal hernia with GERD    Hypercholesterolemia    Hypertension    Obesity    Pre-diabetes    Vitamin D deficiency    No past surgical history on file. Patient Active Problem List   Diagnosis Date Noted   Acid reflux 05/13/2023   Hypertensive disorder 05/13/2023   Obesity 05/13/2023   Osteopenia 05/13/2023   Polyp of cervix 05/13/2023   Seborrheic keratoses 05/13/2023   Uterine leiomyoma 05/13/2023   Preoperative clearance 06/09/2018    PCP: Regino Slater, MD   REFERRING PROVIDER: Regino Slater, MD   REFERRING DIAG: N39.3 (ICD-10-CM) - Stress incontinence (female) (female)  THERAPY DIAG:  Muscle weakness (generalized)  Unspecified lack of coordination  Abnormal posture  Rationale for Evaluation and Treatment: Rehabilitation  ONSET DATE: March of this year is when it became intense   SUBJECTIVE:                                                                                                                                                                                           SUBJECTIVE STATEMENT: Good week so far. Last visit, felt fine when leaving. No noticeable leakage in the past week. No urinary urgency or bowel concerns to report.   From eval: Patient reports to PFPT with stress urinary  incontinence. She had a cold in march that exacerbated this. No pelvic pain to report.  Fluid intake: at least 40 oz water daily, fresh OJ in the mornings, cup of hot tea in the mornings (caffeine)   PAIN:  Are you having pain? No NPRS scale: 0/10  PRECAUTIONS: None  RED FLAGS: None   WEIGHT BEARING RESTRICTIONS: No  FALLS:  Has patient fallen in last 6 months? 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; works part time  at [employer] center (sitting and standing)   ACTIVITY LEVEL : water aerobics 2x/wk at the [facility]; works in the yard; enjoys walking   PLOF: Independent with basic ADLs  PATIENT GOALS: to control the leakage from happening   PERTINENT HISTORY:  C section 1991, 2 total hip replacements (2020, 2022)  Hiatal hernia   BOWEL MOVEMENT: Pain with bowel movement: No Type of bowel movement:Type (Bristol Stool Scale) 3-4, Frequency 1x/day, Strain no, and Splinting no Fully empty rectum: Yes:   Leakage: No Pads: No Fiber supplement/laxative Yes - trying to increase her fiber and protein intake naturally with foods   URINATION: Pain with urination: No Fully empty bladder: Yes:   Stream: Strong Urgency: Yes  Frequency: within normal limits - gets up rarely at night to void  Leakage: Urge to void, Coughing, Sneezing, and Laughing Pads: Yes: wears pads if she feels like she needs it   INTERCOURSE: is currently trying to become more sexually active   Ability to have vaginal penetration No  Pain with intercourse: Initial Penetration DrynessYes  Climax: yes Marinoff Scale: 1/3  PREGNANCY: Vaginal deliveries 0 C-section deliveries 1 Currently pregnant No  PROLAPSE: None  OBJECTIVE:  Note: Objective measures were completed at Evaluation unless otherwise noted.  PATIENT SURVEYS:   PFIQ-7: 16  COGNITION: Overall cognitive status: Within functional limits for tasks assessed     SENSATION: Light touch: Appears intact  LUMBAR SPECIAL  TESTS:  Single leg stance test: Positive  FUNCTIONAL TESTS:  Squat: bilateral dynamic knee valgus with loading, general lumbopelvic stiffness present  GAIT: Assistive device utilized: None Comments: moderate trendelenburg gait pattern with ambulation  POSTURE: rounded shoulders, forward head, and increased thoracic kyphosis  LUMBARAROM/PROM:  A/PROM A/PROM  eval  Flexion 25% limited  Extension 25% limited  Right lateral flexion 25% limited  Left lateral flexion 25% limited  Right rotation 25% limited  Left rotation 25% limited   (Blank rows = not tested)  LOWER EXTREMITY ROM: within functional limits   Active ROM Right eval Left eval  Hip flexion    Hip extension    Hip abduction    Hip adduction    Hip internal rotation    Hip external rotation    Knee flexion    Knee extension    Ankle dorsiflexion    Ankle plantarflexion    Ankle inversion    Ankle eversion     (Blank rows = not tested)  LOWER EXTREMITY MMT: 4/5 bilateral knees and hips grossly  PALPATION:   General: no significant tenderness to palpation of bilateral glutes or hip flexors   Pelvic Alignment: within functional limits   Abdominal: abdominal breathing present at rest with sufficient lower rib excursion                External Perineal Exam: dryness present with sufficient clitoral hood mobility                              Internal Pelvic Floor: Patient fully consents to today's internal examination vaginally. She had no pain with palpation of superficial or deep pelvic floor musculature. With inhalation, patient's pelvic floor lengthens naturally, however she is unable to contract her pelvic floor beyond a small flicker of a contraction. With exhalation, this contraction strength increased to 3/5. She demonstrates a strong lack of coordination in the pelvic floor, but with tactile/verbal cueing form PT, she was able to achieve more active range of  motion of the musculature.   Patient confirms  identification and approves PT to assess internal pelvic floor and treatment Yes No emotional/communication barriers or cognitive limitation. Patient is motivated to learn. Patient understands and agrees with treatment goals and plan. PT explains patient will be examined in standing, sitting, and lying down to see how their muscles and joints work. When they are ready, they will be asked to remove their underwear so PT can examine their perineum. The patient is also given the option of providing their own chaperone as one is not provided in our facility. The patient also has the right and is explained the right to defer or refuse any part of the evaluation or treatment including the internal exam. With the patient's consent, PT will use one gloved finger to gently assess the muscles of the pelvic floor, seeing how well it contracts and relaxes and if there is muscle symmetry. After, the patient will get dressed and PT and patient will discuss exam findings and plan of care. PT and patient discuss plan of care, schedule, attendance policy and HEP activities.  PELVIC MMT:   MMT eval  Vaginal 3/5, 0 quick flicks, 0 second hold  Internal Anal Sphincter   External Anal Sphincter   Puborectalis   Diastasis Recti   (Blank rows = not tested)       TONE: Decreased throughout superficial and deep pelvic floor musculature   PROLAPSE: N/A  TODAY'S TREATMENT:                                                                                                                              DATE:   08/19/23: Neuro re-ed: standing diaphragmatic breathing + pelvic floor lengthening with inhalation and shortening with exhalation 3x10  seated pelvic floor quick flicks + diaphragmatic breathing 2x10  Bird dog against countertop + diaphragmatic breathing 2x10  Standing pull down + march + diaphragmatic breathing 2x10  Therapeutic exercise: Sit to stand + hip abduction (GTB) + diaphragmatic breathing 2x8  Bridge +  hip abduction (GTB) + diaphragmatic breathing 2x8  Seated internal rotation (GTB) + ball squeeze + diaphragmatic breathing 2x10  Lower trunk rotations + diaphragmatic breathing 2x10  Pretzel piriformis stretch + diaphragmatic breathing 2x44min  Self care: Blow as you go technique, relative anatomy and the connection between the diaphragm and pelvic floor musculature, pelvic floor active range of motion and how to achieve that  Knack technique for stress urinary incontinence   09/23/23: Bridge + hip abduction (black TB) + diaphragmatic breathing 2x10  Seated internal rotation (blackTB) + ball squeeze + diaphragmatic breathing 2x10  Sit to stand + black TB + 5# KB + diaphragmatic breathing 2x10  Squat + black TB + 5# KB + diaphragmatic breathing 2x10  Standing alternating march/overhead press holding 2# weights + diaphragmatic breathing 2x10   10/14/23: NuStep level 1 - 5 minutes - PT present to discuss current status  Air squats with GTB resistance around  knees + diaphragmatic breathing x10  Squat + GTB resistance around knees + 5# kettlebell + diaphragmatic breathing 2x10  Bridge + GTB around knees + diaphragmatic breathing 2x15  Monster walks forward backward 10 steps + diaphragmatic breathing (RTB) 2 sets  Standing hip abduction with RTB around ankles + diaphragmatic breathing 2x10   PATIENT EDUCATION:  Education details: Blow as you go technique, relative anatomy and the connection between the diaphragm and pelvic floor musculature, pelvic floor active range of motion and how to achieve that  Person educated: Patient Education method: Explanation, Demonstration, Tactile cues, Verbal cues, and Handouts Education comprehension: verbalized understanding, returned demonstration, verbal cues required, tactile cues required, and needs further education  HOME EXERCISE PROGRAM: Access Code: [email or web address] URL: [email or web address] Date: 10/14/2023 Prepared by: Celena Domino  Exercises - Standing Pelvic Floor Contraction  - 1 x daily - 7 x weekly - 2 sets - 10 reps - Seated Quick Flick Pelvic Floor Contractions  - 1 x daily - 7 x weekly - 2 sets - 10 reps - Seated Hip Internal Rotation with Ball and Resistance  - 1 x daily - 7 x weekly - 2 sets - 12 reps - Squat with Resistance at Thighs  - 1 x daily - 7 x weekly - 2 sets - 10 reps - Supine Bridge with Resistance Band  - 1 x daily - 7 x weekly - 2 sets - 15 reps - Bird Dog on Counter  - 1 x daily - 7 x weekly - 2 sets - 10 reps - Resistance Pulldown with March  - 1 x daily - 7 x weekly - 2 sets - 10 reps - Standing March with Alternating [facility]  - 1 x daily - 7 x weekly - 2 sets - 10 reps - Supine Figure 4 Piriformis Stretch  - 1 x daily - 7 x weekly - 2 sets - hold - Supine Lower Trunk Rotation  - 1 x daily - 7 x weekly - 2 sets - 10 reps - Forward Monster Walks  - 1 x daily - 7 x weekly - 2 sets - 10 reps - Backward Monster Walks  - 1 x daily - 7 x weekly - 2 sets - 10 reps - Standing Hip Abduction with Counter Support  - 1 x daily - 7 x weekly - 2 sets - 10 reps  ASSESSMENT: CLINICAL IMPRESSION: Patient is a 68 y.o. female  who was seen today for physical therapy treatment for stress urinary incontinence.Leakage has been better since first session, no leakage to report since last session. Gluteal and core strengthening progressed accordingly today. All exercises progressed in intensity and repetitions today. She tolerated this very well with minimal verbal cueing from PT. Overall, patient tolerated session well and Pt would benefit from additional PT to further address deficits.    OBJECTIVE IMPAIRMENTS: decreased coordination, decreased endurance, decreased mobility, decreased ROM, and decreased strength.   ACTIVITY LIMITATIONS: continence  PARTICIPATION LIMITATIONS: N/A  PERSONAL FACTORS: Age, Past/current experiences, and Time since onset of injury/illness/exacerbation are also  affecting patient's functional outcome.   REHAB POTENTIAL: Good  CLINICAL DECISION MAKING: Stable/uncomplicated  EVALUATION COMPLEXITY: Low   GOALS: Goals reviewed with patient? Yes  SHORT TERM GOALS: Target date: 07/22/2023  Pt will be independent with HEP.  Baseline: Goal status: INITIAL  2.  Pt will be independent with diaphragmatic breathing and down training activities in order to improve pelvic floor relaxation. Baseline:  Goal status: INITIAL  3.  Pt will be independent with the knack, urge suppression technique, and double voiding in order to improve bladder habits and decrease urinary incontinence.   Baseline:  Goal status: INITIAL  4.  Pt will be able to correctly perform diaphragmatic breathing and appropriate pressure management in order to prevent worsening vaginal wall laxity and improve pelvic floor A/ROM.  Baseline:  Goal status: INITIAL  LONG TERM GOALS: Target date: 12/24/2023  Pt will be independent with advanced HEP.  Baseline:  Goal status: INITIAL  2.  Pt to demonstrate improved coordination of pelvic floor and breathing mechanics with 10# squat with appropriate synergistic patterns to decrease pain and leakage at least 75% of the time for improved ability to complete a 30 minute workout with strain at pelvic floor and symptoms.   Baseline:  Goal status: INITIAL  3.  Pt will have 75% reduced leakage during a typical day to suggest improved overall pelvic floor control and urinary management techniques. Baseline:  Goal status: INITIAL  4.  Pt will demonstrate 3+/5 pelvic floor muscle strength with appropriate coordination in order to decrease urinary incontinence, urgency and frequency.   Baseline:  Goal status: INITIAL  PLAN:  PT FREQUENCY: 1-2x/week  PT DURATION: 12 weeks  PLANNED INTERVENTIONS: 97110-Therapeutic exercises, 97530- Therapeutic activity, 97112- Neuromuscular re-education, 97535- Self Care, [id number]- Manual therapy, Patient/Family  education, Balance training, Taping, Joint mobilization, Spinal mobilization, Scar mobilization, Cryotherapy, and Moist heat  PLAN FOR NEXT SESSION: continued pelvic floor AROM in seated, introduce hip and core strengthening, introduce knack technique and urge drill  Celena JAYSON Domino, PT 10/14/2023, 1:22 PM

## 2023-11-06 ENCOUNTER — Ambulatory Visit: Admitting: Physical Therapy

## 2023-11-06 DIAGNOSIS — R279 Unspecified lack of coordination: Secondary | ICD-10-CM

## 2023-11-06 DIAGNOSIS — R293 Abnormal posture: Secondary | ICD-10-CM

## 2023-11-06 DIAGNOSIS — M6281 Muscle weakness (generalized): Secondary | ICD-10-CM

## 2023-11-06 NOTE — Therapy (Signed)
 OUTPATIENT PHYSICAL THERAPY FEMALE PELVIC TREATMENT   Patient Name: Makayla Wong MRN: [id number] DOB:Apr 29, 1955, 68 y.o., female Today's Date: 11/06/2023  END OF SESSION:  PT End of Session - 11/06/23 1618     Visit Number 6    Number of Visits 8    Date for Recertification  07/22/23    Authorization Type Medicare/[employer]    PT Start Time 0330    PT Stop Time 0415    PT Time Calculation (min) 45 min    Activity Tolerance Patient tolerated treatment well    Behavior During Therapy WFL for tasks assessed/performed               Past Medical History:  Diagnosis Date   Anxiety    Depression    Dysthymia    GERD (gastroesophageal reflux disease)    Hiatal hernia with GERD    Hypercholesterolemia    Hypertension    Obesity    Pre-diabetes    Vitamin D deficiency    No past surgical history on file. Patient Active Problem List   Diagnosis Date Noted   Acid reflux 05/13/2023   Hypertensive disorder 05/13/2023   Obesity 05/13/2023   Osteopenia 05/13/2023   Polyp of cervix 05/13/2023   Seborrheic keratoses 05/13/2023   Uterine leiomyoma 05/13/2023   Preoperative clearance 06/09/2018    PCP: Regino Slater, MD   REFERRING PROVIDER: Regino Slater, MD   REFERRING DIAG: N39.3 (ICD-10-CM) - Stress incontinence (female) (female)  THERAPY DIAG:  Muscle weakness (generalized)  Unspecified lack of coordination  Abnormal posture  Rationale for Evaluation and Treatment: Rehabilitation  ONSET DATE: March of this year is when it became intense   SUBJECTIVE:                                                                                                                                                                                           SUBJECTIVE STATEMENT: Patient reports that her knees started hurting a day after last session and the pain got intense - this was primarily in her right knee joint. She got Naproxin as an anti-inflammatory as needed. She had a  coughing attack this past week and had 1 instance of leakage. No bowel concerns to report. No other pain to report.   From eval: Patient reports to PFPT with stress urinary incontinence. She had a cold in march that exacerbated this. No pelvic pain to report.  Fluid intake: at least 40 oz water daily, fresh OJ in the mornings, cup of hot tea in the mornings (caffeine)   PAIN:  Are you having pain? No NPRS scale: 0/10  PRECAUTIONS: None  RED FLAGS: None   WEIGHT BEARING RESTRICTIONS: No  FALLS:  Has patient fallen in last 6 months? 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; works in the yard; enjoys walking   PLOF: Independent with basic ADLs  PATIENT GOALS: to control the leakage from happening   PERTINENT HISTORY:  C section 1991, 2 total hip replacements (2020, 2022)  Hiatal hernia   BOWEL MOVEMENT: Pain with bowel movement: No Type of bowel movement:Type (Bristol Stool Scale) 3-4, Frequency 1x/day, Strain no, and Splinting no Fully empty rectum: Yes:   Leakage: No Pads: No Fiber supplement/laxative Yes - trying to increase her fiber and protein intake naturally with foods   URINATION: Pain with urination: No Fully empty bladder: Yes:   Stream: Strong Urgency: Yes  Frequency: within normal limits - gets up rarely at night to void  Leakage: Urge to void, Coughing, Sneezing, and Laughing Pads: Yes: wears pads if she feels like she needs it   INTERCOURSE: is currently trying to become more sexually active   Ability to have vaginal penetration No  Pain with intercourse: Initial Penetration DrynessYes  Climax: yes Marinoff Scale: 1/3  PREGNANCY: Vaginal deliveries 0 C-section deliveries 1 Currently pregnant No  PROLAPSE: None  OBJECTIVE:  Note: Objective measures were completed at Evaluation unless otherwise noted.  PATIENT  SURVEYS:   PFIQ-7: 16  COGNITION: Overall cognitive status: Within functional limits for tasks assessed     SENSATION: Light touch: Appears intact  LUMBAR SPECIAL TESTS:  Single leg stance test: Positive  FUNCTIONAL TESTS:  Squat: bilateral dynamic knee valgus with loading, general lumbopelvic stiffness present  GAIT: Assistive device utilized: None Comments: moderate trendelenburg gait pattern with ambulation  POSTURE: rounded shoulders, forward head, and increased thoracic kyphosis  LUMBARAROM/PROM:  A/PROM A/PROM  eval  Flexion 25% limited  Extension 25% limited  Right lateral flexion 25% limited  Left lateral flexion 25% limited  Right rotation 25% limited  Left rotation 25% limited   (Blank rows = not tested)  LOWER EXTREMITY ROM: within functional limits   Active ROM Right eval Left eval  Hip flexion    Hip extension    Hip abduction    Hip adduction    Hip internal rotation    Hip external rotation    Knee flexion    Knee extension    Ankle dorsiflexion    Ankle plantarflexion    Ankle inversion    Ankle eversion     (Blank rows = not tested)  LOWER EXTREMITY MMT: 4/5 bilateral knees and hips grossly  PALPATION:   General: no significant tenderness to palpation of bilateral glutes or hip flexors   Pelvic Alignment: within functional limits   Abdominal: abdominal breathing present at rest with sufficient lower rib excursion                External Perineal Exam: dryness present with sufficient clitoral hood mobility                              Internal Pelvic Floor: Patient fully consents to today's internal examination vaginally. She had no pain with palpation of superficial or deep pelvic floor musculature. With inhalation, patient's pelvic floor lengthens naturally, however she is unable to contract her pelvic floor beyond a small flicker of a contraction. With  exhalation, this contraction strength increased to 3/5. She demonstrates a strong  lack of coordination in the pelvic floor, but with tactile/verbal cueing form PT, she was able to achieve more active range of motion of the musculature.   Patient confirms identification and approves PT to assess internal pelvic floor and treatment Yes No emotional/communication barriers or cognitive limitation. Patient is motivated to learn. Patient understands and agrees with treatment goals and plan. PT explains patient will be examined in standing, sitting, and lying down to see how their muscles and joints work. When they are ready, they will be asked to remove their underwear so PT can examine their perineum. The patient is also given the option of providing their own chaperone as one is not provided in our facility. The patient also has the right and is explained the right to defer or refuse any part of the evaluation or treatment including the internal exam. With the patient's consent, PT will use one gloved finger to gently assess the muscles of the pelvic floor, seeing how well it contracts and relaxes and if there is muscle symmetry. After, the patient will get dressed and PT and patient will discuss exam findings and plan of care. PT and patient discuss plan of care, schedule, attendance policy and HEP activities.  PELVIC MMT:   MMT eval  Vaginal 3/5, 0 quick flicks, 0 second hold  Internal Anal Sphincter   External Anal Sphincter   Puborectalis   Diastasis Recti   (Blank rows = not tested)       TONE: Decreased throughout superficial and deep pelvic floor musculature   PROLAPSE: N/A  TODAY'S TREATMENT:                                                                                                                              DATE:   09/23/23: Bridge + hip abduction (black TB) + diaphragmatic breathing 2x10  Seated internal rotation (blackTB) + ball squeeze + diaphragmatic breathing 2x10  Sit to stand + black TB + 5# KB + diaphragmatic breathing 2x10  Squat + black TB + 5# KB +  diaphragmatic breathing 2x10  Standing alternating march/overhead press holding 2# weights + diaphragmatic breathing 2x10   10/14/23: NuStep level 1 - 5 minutes - PT present to discuss current status  Air squats with GTB resistance around knees + diaphragmatic breathing x10  Squat + GTB resistance around knees + 5# kettlebell + diaphragmatic breathing 2x10  Bridge + GTB around knees + diaphragmatic breathing 2x15  Monster walks forward backward 10 steps + diaphragmatic breathing (RTB) 2 sets  Standing hip abduction with RTB around ankles + diaphragmatic breathing 2x10   11/06/23: Discussion of modification of HEP due to knee pain  NuStep level 1 - 6 minutes - PT present to discuss current status  Sit to stand with GTB around knees + diaphragmatic breathing 2x10  Seated abdominal ball press + transverse abdominis activation on exhale 2x10  Supine straight leg raise + diaphragmatic breathing 2x10  Seated march + GTB around knees + diaphragmatic breathing 2x10   PATIENT EDUCATION:  Education details: Blow as you go technique, relative anatomy and the connection between the diaphragm and pelvic floor musculature, pelvic floor active range of motion and how to achieve that  Person educated: Patient Education method: Explanation, Demonstration, Tactile cues, Verbal cues, and Handouts Education comprehension: verbalized understanding, returned demonstration, verbal cues required, tactile cues required, and needs further education  HOME EXERCISE PROGRAM: Access Code: [email or web address] URL: [email or web address] Date: 11/06/2023 Prepared by: Celena Domino  Exercises - Standing Pelvic Floor Contraction  - 1 x daily - 7 x weekly - 2 sets - 10 reps - Seated Quick Flick Pelvic Floor Contractions  - 1 x daily - 7 x weekly - 2 sets - 10 reps - Seated Hip Internal Rotation with Ball and Resistance  - 1 x daily - 7 x weekly - 2 sets - 12 reps - Squat with Chair Touch and Resistance Loop  - 1 x  daily - 7 x weekly - 2 sets - 10 reps - Supine Bridge with Resistance Band  - 1 x daily - 7 x weekly - 2 sets - 15 reps - Bird Dog on Counter  - 1 x daily - 7 x weekly - 2 sets - 10 reps - Resistance Pulldown with March  - 1 x daily - 7 x weekly - 2 sets - 10 reps - Standing March with Alternating Med [facility]  - 1 x daily - 7 x weekly - 2 sets - 10 reps - Seated Abdominal Press into [employer]  - 1 x daily - 7 x weekly - 2 sets - 10 reps - Supine Active Straight Leg Raise  - 1 x daily - 7 x weekly - 2 sets - 10 reps - Seated March with Resistance  - 1 x daily - 7 x weekly - 2 sets - 10 reps - Supine Figure 4 Piriformis Stretch  - 1 x daily - 7 x weekly - 2 sets - hold - Supine Lower Trunk Rotation  - 1 x daily - 7 x weekly - 2 sets - 10 reps  ASSESSMENT: CLINICAL IMPRESSION: Patient is a 68 y.o. female  who was seen today for physical therapy treatment for stress urinary incontinence.Leakage has been better since first session, only 1 instance of leakage this week. Gluteal and core strengthening progressed accordingly today with modifications made due to right knee pain. All exercises progressed in intensity and repetitions today. She tolerated this very well with minimal verbal cueing from PT. Overall, patient tolerated session well and Pt would benefit from additional PT to further address deficits.    OBJECTIVE IMPAIRMENTS: decreased coordination, decreased endurance, decreased mobility, decreased ROM, and decreased strength.   ACTIVITY LIMITATIONS: continence  PARTICIPATION LIMITATIONS: N/A  PERSONAL FACTORS: Age, Past/current experiences, and Time since onset of injury/illness/exacerbation are also affecting patient's functional outcome.   REHAB POTENTIAL: Good  CLINICAL DECISION MAKING: Stable/uncomplicated  EVALUATION COMPLEXITY: Low   GOALS: Goals reviewed with patient? Yes  SHORT TERM GOALS: Target date: 07/22/2023  Pt will be independent with HEP.  Baseline: Goal  status: INITIAL  2.  Pt will be independent with diaphragmatic breathing and down training activities in order to improve pelvic floor relaxation. Baseline:  Goal status: INITIAL  3.  Pt will be independent with the knack, urge suppression technique, and double voiding in order to improve bladder habits and decrease  urinary incontinence.   Baseline:  Goal status: INITIAL  4.  Pt will be able to correctly perform diaphragmatic breathing and appropriate pressure management in order to prevent worsening vaginal wall laxity and improve pelvic floor A/ROM.  Baseline:  Goal status: INITIAL  LONG TERM GOALS: Target date: 12/24/2023  Pt will be independent with advanced HEP.  Baseline:  Goal status: INITIAL  2.  Pt to demonstrate improved coordination of pelvic floor and breathing mechanics with 10# squat with appropriate synergistic patterns to decrease pain and leakage at least 75% of the time for improved ability to complete a 30 minute workout with strain at pelvic floor and symptoms.   Baseline:  Goal status: INITIAL  3.  Pt will have 75% reduced leakage during a typical day to suggest improved overall pelvic floor control and urinary management techniques. Baseline:  Goal status: INITIAL  4.  Pt will demonstrate 3+/5 pelvic floor muscle strength with appropriate coordination in order to decrease urinary incontinence, urgency and frequency.   Baseline:  Goal status: INITIAL  PLAN:  PT FREQUENCY: 1-2x/week  PT DURATION: 12 weeks  PLANNED INTERVENTIONS: 97110-Therapeutic exercises, 97530- Therapeutic activity, 97112- Neuromuscular re-education, 97535- Self Care, [id number]- Manual therapy, Patient/Family education, Balance training, Taping, Joint mobilization, Spinal mobilization, Scar mobilization, Cryotherapy, and Moist heat  PLAN FOR NEXT SESSION: continued pelvic floor AROM in seated, introduce hip and core strengthening, introduce knack technique and urge drill  Celena JAYSON Domino,  PT 11/06/2023, 4:19 PM

## 2023-11-19 ENCOUNTER — Ambulatory Visit: Attending: Family Medicine | Admitting: Physical Therapy

## 2023-11-19 DIAGNOSIS — M6281 Muscle weakness (generalized): Secondary | ICD-10-CM | POA: Diagnosis present

## 2023-11-19 DIAGNOSIS — R279 Unspecified lack of coordination: Secondary | ICD-10-CM | POA: Diagnosis present

## 2023-11-19 DIAGNOSIS — R293 Abnormal posture: Secondary | ICD-10-CM | POA: Diagnosis present

## 2023-11-19 NOTE — Therapy (Signed)
 OUTPATIENT PHYSICAL THERAPY FEMALE PELVIC DISCHARGE SUMMARY   Patient Name: Makayla Wong MRN: [id number] DOB:1955/01/20, 68 y.o., female Today's Date: 11/19/2023  END OF SESSION:  PT End of Session - 11/19/23 1423     Visit Number 7    Number of Visits 8    Date for Recertification  07/22/23    Authorization Type Medicare/[employer]    PT Start Time 0200    PT Stop Time 0240    PT Time Calculation (min) 40 min    Activity Tolerance Patient tolerated treatment well    Behavior During Therapy WFL for tasks assessed/performed                Past Medical History:  Diagnosis Date   Anxiety    Depression    Dysthymia    GERD (gastroesophageal reflux disease)    Hiatal hernia with GERD    Hypercholesterolemia    Hypertension    Obesity    Pre-diabetes    Vitamin D deficiency    No past surgical history on file. Patient Active Problem List   Diagnosis Date Noted   Acid reflux 05/13/2023   Hypertensive disorder 05/13/2023   Obesity 05/13/2023   Osteopenia 05/13/2023   Polyp of cervix 05/13/2023   Seborrheic keratoses 05/13/2023   Uterine leiomyoma 05/13/2023   Preoperative clearance 06/09/2018    PCP: Regino Slater, MD   REFERRING PROVIDER: Regino Slater, MD   REFERRING DIAG: N39.3 (ICD-10-CM) - Stress incontinence (female) (female)  THERAPY DIAG:  Muscle weakness (generalized)  Unspecified lack of coordination  Abnormal posture  Rationale for Evaluation and Treatment: Rehabilitation  ONSET DATE: March of this year is when it became intense   SUBJECTIVE:                                                                                                                                                                                           SUBJECTIVE STATEMENT: Knee is feeling better - she is pleased with this. She has been coughing less - just a small amount of leakage with coughing this week. No bowel concerns or pelvic pain to report. She feels  confident with the knack technique.   From eval: Patient reports to PFPT with stress urinary incontinence. She had a cold in march that exacerbated this. No pelvic pain to report.  Fluid intake: at least 40 oz water daily, fresh OJ in the mornings, cup of hot tea in the mornings (caffeine)   PAIN:  Are you having pain? No NPRS scale: 0/10  PRECAUTIONS: None  RED FLAGS: None   WEIGHT BEARING RESTRICTIONS: No  FALLS:  Has patient  fallen in last 6 months? 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; works in the yard; enjoys walking   PLOF: Independent with basic ADLs  PATIENT GOALS: to control the leakage from happening   PERTINENT HISTORY:  C section 1991, 2 total hip replacements (2020, 2022)  Hiatal hernia   BOWEL MOVEMENT: Pain with bowel movement: No Type of bowel movement:Type (Bristol Stool Scale) 3-4, Frequency 1x/day, Strain no, and Splinting no Fully empty rectum: Yes:   Leakage: No Pads: No Fiber supplement/laxative Yes - trying to increase her fiber and protein intake naturally with foods   URINATION: Pain with urination: No Fully empty bladder: Yes:   Stream: Strong Urgency: Yes  Frequency: within normal limits - gets up rarely at night to void  Leakage: Urge to void, Coughing, Sneezing, and Laughing Pads: Yes: wears pads if she feels like she needs it   INTERCOURSE: is currently trying to become more sexually active   Ability to have vaginal penetration No  Pain with intercourse: Initial Penetration DrynessYes  Climax: yes Marinoff Scale: 1/3  PREGNANCY: Vaginal deliveries 0 C-section deliveries 1 Currently pregnant No  PROLAPSE: None  OBJECTIVE:  Note: Objective measures were completed at Evaluation unless otherwise noted.  PATIENT SURVEYS:   PFIQ-7: 16  COGNITION: Overall cognitive status: Within  functional limits for tasks assessed     SENSATION: Light touch: Appears intact  LUMBAR SPECIAL TESTS:  Single leg stance test: Positive  FUNCTIONAL TESTS:  Squat: bilateral dynamic knee valgus with loading, general lumbopelvic stiffness present  GAIT: Assistive device utilized: None Comments: moderate trendelenburg gait pattern with ambulation  POSTURE: rounded shoulders, forward head, and increased thoracic kyphosis  LUMBARAROM/PROM:  A/PROM A/PROM  eval  Flexion 25% limited  Extension 25% limited  Right lateral flexion 25% limited  Left lateral flexion 25% limited  Right rotation 25% limited  Left rotation 25% limited   (Blank rows = not tested)  LOWER EXTREMITY ROM: within functional limits   Active ROM Right eval Left eval  Hip flexion    Hip extension    Hip abduction    Hip adduction    Hip internal rotation    Hip external rotation    Knee flexion    Knee extension    Ankle dorsiflexion    Ankle plantarflexion    Ankle inversion    Ankle eversion     (Blank rows = not tested)  LOWER EXTREMITY MMT: 4/5 bilateral knees and hips grossly  PALPATION:   General: no significant tenderness to palpation of bilateral glutes or hip flexors   Pelvic Alignment: within functional limits   Abdominal: abdominal breathing present at rest with sufficient lower rib excursion                External Perineal Exam: dryness present with sufficient clitoral hood mobility                              Internal Pelvic Floor: Patient fully consents to today's internal examination vaginally. She had no pain with palpation of superficial or deep pelvic floor musculature. With inhalation, patient's pelvic floor lengthens naturally, however she is unable to contract her pelvic floor beyond a small flicker of a contraction. With exhalation, this contraction strength increased to 3/5. She demonstrates a strong lack of coordination  in the pelvic floor, but with tactile/verbal cueing  form PT, she was able to achieve more active range of motion of the musculature.   Patient confirms identification and approves PT to assess internal pelvic floor and treatment Yes No emotional/communication barriers or cognitive limitation. Patient is motivated to learn. Patient understands and agrees with treatment goals and plan. PT explains patient will be examined in standing, sitting, and lying down to see how their muscles and joints work. When they are ready, they will be asked to remove their underwear so PT can examine their perineum. The patient is also given the option of providing their own chaperone as one is not provided in our facility. The patient also has the right and is explained the right to defer or refuse any part of the evaluation or treatment including the internal exam. With the patient's consent, PT will use one gloved finger to gently assess the muscles of the pelvic floor, seeing how well it contracts and relaxes and if there is muscle symmetry. After, the patient will get dressed and PT and patient will discuss exam findings and plan of care. PT and patient discuss plan of care, schedule, attendance policy and HEP activities.  PELVIC MMT:   MMT eval  Vaginal 3/5, 0 quick flicks, 0 second hold  Internal Anal Sphincter   External Anal Sphincter   Puborectalis   Diastasis Recti   (Blank rows = not tested)       TONE: Decreased throughout superficial and deep pelvic floor musculature   PROLAPSE: N/A  TODAY'S TREATMENT:                                                                                                                              DATE:   10/14/23: NuStep level 1 - 5 minutes - PT present to discuss current status  Air squats with GTB resistance around knees + diaphragmatic breathing x10  Squat + GTB resistance around knees + 5# kettlebell + diaphragmatic breathing 2x10  Bridge + GTB around knees + diaphragmatic breathing 2x15  Monster walks forward  backward 10 steps + diaphragmatic breathing (RTB) 2 sets  Standing hip abduction with RTB around ankles + diaphragmatic breathing 2x10   11/06/23: Discussion of modification of HEP due to knee pain  NuStep level 1 - 6 minutes - PT present to discuss current status  Sit to stand with GTB around knees + diaphragmatic breathing 2x10  Seated abdominal ball press + transverse abdominis activation on exhale 2x10  Supine straight leg raise + diaphragmatic breathing 2x10  Seated march + GTB around knees + diaphragmatic breathing 2x10   11/19/23: Review of current HEP for patient to continue independently after discharge  Seated diaphragmatic breathing + pelvic floor contraction 2x10  Supine straight leg raise + diaphragmatic breathing 2x10 each side  Review of knack technique for managing stress urinary incontinence  Review of urge drill for managing urge urinary incontinence  Bridge + adductor  ball squeeze + diaphragmatic breathing 2x10  Figure 4 piriformis stretch in supine (sliding with external rotation) + diaphragmatic breathing 2x10   PATIENT EDUCATION:  Education details: Blow as you go technique, relative anatomy and the connection between the diaphragm and pelvic floor musculature, pelvic floor active range of motion and how to achieve that  Person educated: Patient Education method: Explanation, Demonstration, Tactile cues, Verbal cues, and Handouts Education comprehension: verbalized understanding, returned demonstration, verbal cues required, tactile cues required, and needs further education  HOME EXERCISE PROGRAM: Access Code: [email or web address] URL: [email or web address] Date: 11/06/2023 Prepared by: Celena Domino  Exercises - Standing Pelvic Floor Contraction  - 1 x daily - 7 x weekly - 2 sets - 10 reps - Seated Quick Flick Pelvic Floor Contractions  - 1 x daily - 7 x weekly - 2 sets - 10 reps - Seated Hip Internal Rotation with Ball and Resistance  - 1 x daily - 7 x  weekly - 2 sets - 12 reps - Squat with Chair Touch and Resistance Loop  - 1 x daily - 7 x weekly - 2 sets - 10 reps - Supine Bridge with Resistance Band  - 1 x daily - 7 x weekly - 2 sets - 15 reps - Bird Dog on Counter  - 1 x daily - 7 x weekly - 2 sets - 10 reps - Resistance Pulldown with March  - 1 x daily - 7 x weekly - 2 sets - 10 reps - Standing March with Alternating Med [facility]  - 1 x daily - 7 x weekly - 2 sets - 10 reps - Seated Abdominal Press into [employer]  - 1 x daily - 7 x weekly - 2 sets - 10 reps - Supine Active Straight Leg Raise  - 1 x daily - 7 x weekly - 2 sets - 10 reps - Seated March with Resistance  - 1 x daily - 7 x weekly - 2 sets - 10 reps - Supine Figure 4 Piriformis Stretch  - 1 x daily - 7 x weekly - 2 sets - hold - Supine Lower Trunk Rotation  - 1 x daily - 7 x weekly - 2 sets - 10 reps  ASSESSMENT: CLINICAL IMPRESSION: Patient is a 68 y.o. female  who was seen today for physical therapy treatment for stress urinary incontinence.Leakage has been better since first session, only leaking with coughing these days. She feels confident with managing her stress urinary incontinence and urge urinary incontinence moving forward. She has met all goals and is appropriate for discharge at this time.   OBJECTIVE IMPAIRMENTS: decreased coordination, decreased endurance, decreased mobility, decreased ROM, and decreased strength.   ACTIVITY LIMITATIONS: continence  PARTICIPATION LIMITATIONS: N/A  PERSONAL FACTORS: Age, Past/current experiences, and Time since onset of injury/illness/exacerbation are also affecting patient's functional outcome.   REHAB POTENTIAL: Good  CLINICAL DECISION MAKING: Stable/uncomplicated  EVALUATION COMPLEXITY: Low   GOALS: Goals reviewed with patient? Yes  SHORT TERM GOALS: Target date: 07/22/2023  Pt will be independent with HEP.  Baseline: Goal status: GOAL MET 11/19/23  2.  Pt will be independent with diaphragmatic  breathing and down training activities in order to improve pelvic floor relaxation. Baseline:  Goal status: GOAL MET 11/19/23  3.  Pt will be independent with the knack, urge suppression technique, and double voiding in order to improve bladder habits and decrease urinary incontinence.   Baseline:  Goal status: GOAL MET 11/19/23  4.  Pt will be able  to correctly perform diaphragmatic breathing and appropriate pressure management in order to prevent worsening vaginal wall laxity and improve pelvic floor A/ROM.  Baseline:  Goal status: GOAL MET 11/19/23  LONG TERM GOALS: Target date: 12/24/2023  Pt will be independent with advanced HEP.  Baseline:  Goal status: GOAL MET 11/19/23  2.  Pt to demonstrate improved coordination of pelvic floor and breathing mechanics with 10# squat with appropriate synergistic patterns to decrease pain and leakage at least 75% of the time for improved ability to complete a 30 minute workout with strain at pelvic floor and symptoms.   Baseline:  Goal status: GOAL MET 11/19/23  3.  Pt will have 75% reduced leakage during a typical day to suggest improved overall pelvic floor control and urinary management techniques. Baseline:  Goal status: GOAL MET 11/19/23  4.  Pt will demonstrate 3+/5 pelvic floor muscle strength with appropriate coordination in order to decrease urinary incontinence, urgency and frequency.   Baseline:  Goal status: GOAL MET 11/19/23  PHYSICAL THERAPY DISCHARGE SUMMARY  Visits from Start of Care: 7  Current functional level related to goals / functional outcomes: See above   Remaining deficits: See above   Education / Equipment: See above   Patient agrees to discharge. Patient goals were met. Patient is being discharged due to meeting the stated rehab goals.   Celena JAYSON Domino, PT 11/19/2023, 2:23 PM

## 2023-11-19 NOTE — Patient Instructions (Signed)
